# Patient Record
Sex: Female | Born: 1980 | Race: White | Hispanic: Yes | Marital: Single | Smoking: Current some day smoker
Health system: Southern US, Community
[De-identification: ages and names within clinical notes are randomized; demographics above are authoritative.]

## PROBLEM LIST (undated history)

## (undated) DIAGNOSIS — I10 Essential (primary) hypertension: Secondary | ICD-10-CM

## (undated) HISTORY — PX: HERNIA REPAIR: SHX51

## (undated) HISTORY — PX: OTHER SURGICAL HISTORY: SHX169

## (undated) HISTORY — PX: APPENDECTOMY: SHX54

## (undated) HISTORY — DX: Essential (primary) hypertension: I10

---
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
## 2012-09-29 ENCOUNTER — Encounter: Payer: Self-pay | Admitting: Family Medicine

## 2012-09-29 ENCOUNTER — Ambulatory Visit (INDEPENDENT_AMBULATORY_CARE_PROVIDER_SITE_OTHER): Payer: Medicaid Other | Admitting: Family Medicine

## 2012-09-29 VITALS — BP 140/76 | HR 74 | Temp 98.3°F | Ht 61.0 in | Wt 248.0 lb

## 2012-09-29 DIAGNOSIS — Z0000 Encounter for general adult medical examination without abnormal findings: Secondary | ICD-10-CM

## 2012-09-29 DIAGNOSIS — Z23 Encounter for immunization: Secondary | ICD-10-CM

## 2012-09-29 LAB — POCT CBC
Granulocyte percent: 66.6 %G (ref 37–80)
HCT, POC: 36.9 % — AB (ref 37.7–47.9)
Hemoglobin: 12 g/dL — AB (ref 12.2–16.2)
Lymph, poc: 2.6 (ref 0.6–3.4)
MCH, POC: 25.2 pg — AB (ref 27–31.2)
MCHC: 32.6 g/dL (ref 31.8–35.4)
MCV: 77.5 fL — AB (ref 80–97)
MPV: 8 fL (ref 0–99.8)
POC Granulocyte: 6 (ref 2–6.9)
POC LYMPH PERCENT: 29 %L (ref 10–50)
Platelet Count, POC: 331 10*3/uL (ref 142–424)
RBC: 4.8 M/uL (ref 4.04–5.48)
RDW, POC: 13.8 %
WBC: 9 10*3/uL (ref 4.6–10.2)

## 2012-09-29 NOTE — Progress Notes (Signed)
  Subjective:    Patient ID: Denise Berger, female    DOB: 1980-07-31, 32 y.o.   MRN: [id number]  HPI  This 32 y.o. female presents for evaluation of CPE without pap. She has an appointment scheduled Next week with female provider for pap.  She has been snoring but denies any hypersomnolence. She has hx of obesity.  She denies any acute medical problems.  Review of Systems    No chest pain, SOB, HA, dizziness, vision change, N/V, diarrhea, constipation, dysuria, urinary urgency or frequency, myalgias, arthralgias or rash.  Objective:   Physical Exam Vital signs noted  Well developed well nourished female.  HEENT - Head atraumatic Normocephalic                Eyes - PERRLA, Conjuctiva - clear Sclera- Clear EOMI                Ears - EAC's Wnl TM's Wnl Gross Hearing WNL                Nose - Nares patent                 Throat - oropharanx wnl Respiratory - Lungs CTA bilateral Cardiac - RRR S1 and S2 without murmur GI - Abdomen soft Nontender and bowel sounds active x 4 Extremities - No edema. Neuro - Grossly intact.       Assessment & Plan:  Need for Tdap vaccination - Plan: Tdap vaccine greater than or equal to 7yo IM  Routine general medical examination at a health care facility - Plan: POCT CBC, Lipid panel, CMP14+EGFR, Thyroid Panel With TSH, Vit D  25 hydroxy (rtn osteoporosis monitoring). Discussed with patient if snoring worsens and she is having hypersomnolence then would need Sleep study.  Obesity - Discussed aggressive weight loss efforts  Follow up this week for female exam.  Deatra Canter FNP

## 2012-09-29 NOTE — Patient Instructions (Addendum)

## 2012-09-30 LAB — LIPID PANEL
Chol/HDL Ratio: 4.3 ratio units (ref 0.0–4.4)
Cholesterol, Total: 175 mg/dL (ref 100–199)
HDL: 41 mg/dL (ref >39)
LDL Calculated: 114 mg/dL — ABNORMAL HIGH (ref 0–99)
Triglycerides: 99 mg/dL (ref 0–149)
VLDL Cholesterol Cal: 20 mg/dL (ref 5–40)

## 2012-09-30 LAB — CMP14+EGFR
ALT: 20 IU/L (ref 0–32)
AST: 19 IU/L (ref 0–40)
Albumin/Globulin Ratio: 2 (ref 1.1–2.5)
Albumin: 4.5 g/dL (ref 3.5–5.5)
Alkaline Phosphatase: 60 IU/L (ref 39–117)
BUN/Creatinine Ratio: 17 (ref 8–20)
BUN: 10 mg/dL (ref 6–20)
CO2: 28 mmol/L (ref 18–29)
Calcium: 9.7 mg/dL (ref 8.7–10.2)
Chloride: 96 mmol/L — ABNORMAL LOW (ref 97–108)
Creatinine, Ser: 0.59 mg/dL (ref 0.57–1.00)
GFR calc Af Amer: 142 mL/min/{1.73_m2} (ref >59)
GFR calc non Af Amer: 123 mL/min/{1.73_m2} (ref >59)
Globulin, Total: 2.3 g/dL (ref 1.5–4.5)
Glucose: 89 mg/dL (ref 65–99)
Potassium: 4.6 mmol/L (ref 3.5–5.2)
Sodium: 138 mmol/L (ref 134–144)
Total Bilirubin: 0.3 mg/dL (ref 0.0–1.2)
Total Protein: 6.8 g/dL (ref 6.0–8.5)

## 2012-09-30 LAB — THYROID PANEL WITH TSH
Free Thyroxine Index: 2.3 (ref 1.2–4.9)
T3 Uptake Ratio: 26 % (ref 24–39)
T4, Total: 9 ug/dL (ref 4.5–12.0)
TSH: 3.1 u[IU]/mL (ref 0.450–4.500)

## 2012-09-30 LAB — VITAMIN D 25 HYDROXY (VIT D DEFICIENCY, FRACTURES): Vit D, 25-Hydroxy: 29.7 ng/mL — ABNORMAL LOW (ref 30.0–100.0)

## 2012-10-06 ENCOUNTER — Ambulatory Visit (INDEPENDENT_AMBULATORY_CARE_PROVIDER_SITE_OTHER): Payer: Medicaid Other | Admitting: Nurse Practitioner

## 2012-10-06 VITALS — BP 135/82 | HR 74 | Temp 97.5°F | Ht 61.0 in | Wt 250.0 lb

## 2012-10-06 DIAGNOSIS — G43909 Migraine, unspecified, not intractable, without status migrainosus: Secondary | ICD-10-CM | POA: Insufficient documentation

## 2012-10-06 MED ORDER — KETOROLAC TROMETHAMINE 60 MG/2ML IM SOLN
60.0000 mg | Freq: Once | INTRAMUSCULAR | Status: AC
  Administered 2012-10-06: 60 mg via INTRAMUSCULAR

## 2012-10-06 MED ORDER — BUTALBITAL-APAP-CAFF-COD 50-325-40-30 MG PO CAPS: 1.0000 | ORAL_CAPSULE | ORAL | Status: DC | PRN

## 2012-10-06 NOTE — Patient Instructions (Signed)
Migraine Headache A migraine headache is an intense, throbbing pain on one or both sides of your head. A migraine can last for 30 minutes to several hours. CAUSES  The exact cause of a migraine headache is not always known. However, a migraine may be caused when nerves in the brain become irritated and release chemicals that cause inflammation. This causes pain. SYMPTOMS  Pain on one or both sides of your head.  Pulsating or throbbing pain.  Severe pain that prevents daily activities.  Pain that is aggravated by any physical activity.  Nausea, vomiting, or both.  Dizziness.  Pain with exposure to bright lights, loud noises, or activity.  General sensitivity to bright lights, loud noises, or smells. Before you get a migraine, you may get warning signs that a migraine is coming (aura). An aura may include:  Seeing flashing lights.  Seeing bright spots, halos, or zig-zag lines.  Having tunnel vision or blurred vision.  Having feelings of numbness or tingling.  Having trouble talking.  Having muscle weakness. MIGRAINE TRIGGERS  Alcohol.  Smoking.  Stress.  Menstruation.  Aged cheeses.  Foods or drinks that contain nitrates, glutamate, aspartame, or tyramine.  Lack of sleep.  Chocolate.  Caffeine.  Hunger.  Physical exertion.  Fatigue.  Medicines used to treat chest pain (nitroglycerine), birth control pills, estrogen, and some blood pressure medicines. DIAGNOSIS  A migraine headache is often diagnosed based on:  Symptoms.  Physical examination.  A CT scan or MRI of your head. TREATMENT Medicines may be given for pain and nausea. Medicines can also be given to help prevent recurrent migraines.  HOME CARE INSTRUCTIONS  Only take over-the-counter or prescription medicines for pain or discomfort as directed by your caregiver. The use of long-term narcotics is not recommended.  Lie down in a dark, quiet room when you have a migraine.  Keep a journal  to find out what may trigger your migraine headaches. For example, write down:  What you eat and drink.  How much sleep you get.  Any change to your diet or medicines.  Limit alcohol consumption.  Quit smoking if you smoke.  Get 7 to 9 hours of sleep, or as recommended by your caregiver.  Limit stress.  Keep lights dim if bright lights bother you and make your migraines worse. SEEK IMMEDIATE MEDICAL CARE IF:   Your migraine becomes severe.  You have a fever.  You have a stiff neck.  You have vision loss.  You have muscular weakness or loss of muscle control.  You start losing your balance or have trouble walking.  You feel faint or pass out.  You have severe symptoms that are different from your first symptoms. MAKE SURE YOU:   Understand these instructions.  Will watch your condition.  Will get help right away if you are not doing well or get worse. Document Released: 12/22/2004 Document Revised: 03/16/2011 Document Reviewed: 12/12/2010 [employer] Patient Information 2014 [employer].  

## 2012-10-06 NOTE — Progress Notes (Signed)
  Subjective:    Patient ID: Denise Berger, female    DOB: 01-20-1980, 32 y.o.   MRN: [id number]  HPI Patient has a long history of migraine headache- this current headache started yesterday- has taken excedrin migriane and that as not helped.     Review of Systems  Constitutional: Positive for fatigue.  Eyes: Positive for photophobia.  Respiratory: Negative.   Cardiovascular: Negative.   Gastrointestinal: Positive for nausea.  Neurological: Positive for light-headedness and headaches. Negative for dizziness, tremors, seizures, syncope, facial asymmetry, speech difficulty, weakness and numbness.       Objective:   Physical Exam  Constitutional: She is oriented to person, place, and time. She appears well-developed and well-nourished.  Cardiovascular: Normal rate, regular rhythm and normal heart sounds.   Pulmonary/Chest: Effort normal.  Neurological: She is alert and oriented to person, place, and time. She has normal reflexes. No cranial nerve deficit.  Psychiatric: She has a normal mood and affect. Her behavior is normal. Judgment and thought content normal.    BP 135/82  Pulse 74  Temp(Src) 97.5 F (36.4 C) (Oral)  Ht 5\' 1"  (1.549 m)  Wt 250 lb (113.399 kg)  BMI 47.26 kg/m2  LMP 09/26/2012       Assessment & Plan:   1. Migraines    Meds ordered this encounter  Medications  . ketorolac (TORADOL) injection 60 mg    Sig:   . butalbital-acetaminophen-caffeine (FIORICET/CODEINE) 50-325-40-30 MG per capsule    Sig: Take 1 capsule by mouth every 4 (four) hours as needed for headache.    Dispense:  30 capsule    Refill:  0    Order Specific Question:  Supervising Provider    Answer:  Ernestina Penna [[id number]]   Rest  Cool compress to forehead RTO prn  [name], FNP

## 2012-10-17 ENCOUNTER — Telehealth: Payer: Self-pay | Admitting: *Deleted

## 2012-10-17 NOTE — Telephone Encounter (Signed)
Ins will not cover fiorinal with codeine unless pt has tried and failed codeine/acetaminophen or hydrochodone/acetaminophen or hydrcocodone/ ibuprofen or pentazocine/acetaminophen.

## 2012-10-17 NOTE — Telephone Encounter (Signed)
Let patient know and ask if has used any of these

## 2012-10-19 ENCOUNTER — Telehealth: Payer: Self-pay | Admitting: *Deleted

## 2012-10-19 MED ORDER — ACETAMINOPHEN-CODEINE #3 300-30 MG PO TABS: ORAL_TABLET | ORAL | Status: DC

## 2012-10-19 NOTE — Telephone Encounter (Signed)
Called and talked with Denise Berger and she has not taken any of the three alternatives and she said any of them would be fine/ Just call in to [facility]. Thanks for handling this.

## 2012-10-19 NOTE — Telephone Encounter (Signed)
Will need to pick up new rx

## 2012-10-20 NOTE — Telephone Encounter (Signed)
Up front 

## 2012-11-30 ENCOUNTER — Ambulatory Visit (INDEPENDENT_AMBULATORY_CARE_PROVIDER_SITE_OTHER): Payer: Medicaid Other | Admitting: Nurse Practitioner

## 2012-11-30 ENCOUNTER — Encounter (INDEPENDENT_AMBULATORY_CARE_PROVIDER_SITE_OTHER): Payer: Self-pay

## 2012-11-30 ENCOUNTER — Encounter: Payer: Self-pay | Admitting: Nurse Practitioner

## 2012-11-30 VITALS — BP 143/92 | HR 81 | Temp 98.6°F | Ht 61.0 in | Wt 251.0 lb

## 2012-11-30 DIAGNOSIS — J029 Acute pharyngitis, unspecified: Secondary | ICD-10-CM

## 2012-11-30 DIAGNOSIS — J019 Acute sinusitis, unspecified: Secondary | ICD-10-CM

## 2012-11-30 DIAGNOSIS — R5381 Other malaise: Secondary | ICD-10-CM

## 2012-11-30 DIAGNOSIS — R531 Weakness: Secondary | ICD-10-CM

## 2012-11-30 LAB — POCT RAPID STREP A (OFFICE): Rapid Strep A Screen: NEGATIVE

## 2012-11-30 MED ORDER — AZITHROMYCIN 250 MG PO TABS: ORAL_TABLET | ORAL | Status: DC

## 2012-11-30 NOTE — Patient Instructions (Signed)

## 2012-11-30 NOTE — Progress Notes (Signed)
Subjective:    Patient ID: Denise Berger, female    DOB: 09-10-1980, 32 y.o.   MRN: [id number]  HPI Patient in c/o sore throat and cough that started last week- got better then started back again yesterday- said that throat hurts really bad.  * She has also had some episodes where she feels shaky- her father is a diabetic and he has checked her blood sugar and sometimes it has been over 200.  Review of Systems  Constitutional: Positive for appetite change and fatigue. Negative for fever and chills. Activity change: increased.  HENT: Positive for congestion, postnasal drip, rhinorrhea, sinus pressure, sore throat and trouble swallowing. Negative for ear pain.   Respiratory: Positive for cough.   Cardiovascular: Negative.   Gastrointestinal: Negative.   Genitourinary: Negative.   Neurological: Positive for weakness.       Objective:   Physical Exam  Constitutional: She appears well-developed and well-nourished.  HENT:  Right Ear: Hearing, tympanic membrane, external ear and ear canal normal.  Left Ear: Hearing, tympanic membrane, external ear and ear canal normal.  Nose: Mucosal edema and rhinorrhea present. Right sinus exhibits maxillary sinus tenderness. Right sinus exhibits no frontal sinus tenderness. Left sinus exhibits maxillary sinus tenderness. Left sinus exhibits no frontal sinus tenderness.  Mouth/Throat: Uvula is midline and mucous membranes are normal. Posterior oropharyngeal erythema (mild) present. No oropharyngeal exudate or posterior oropharyngeal edema.  Eyes: Conjunctivae and EOM are normal. Pupils are equal, round, and reactive to light.  Neck: Normal range of motion. Neck supple. No thyromegaly present.  Cardiovascular: Normal rate, normal heart sounds and intact distal pulses.   Pulmonary/Chest: Effort normal and breath sounds normal.  Abdominal: Soft. Bowel sounds are normal.  Lymphadenopathy:    She has no cervical adenopathy.  Psychiatric: She has a normal mood  and affect. Her behavior is normal. Judgment and thought content normal.   BP 143/92  Pulse 81  Temp(Src) 98.6 F (37 C) (Oral)  Ht 5\' 1"  (1.549 m)  Wt 251 lb (113.853 kg)  BMI 47.45 kg/m2 Results for orders placed in visit on 11/30/12  POCT RAPID STREP A (OFFICE)      Result Value Range   Rapid Strep A Screen Negative  Negative          Assessment & Plan:   1. Sore throat   2. Acute rhinosinusitis   3. Weakness    Meds ordered this encounter  Medications  . azithromycin (ZITHROMAX) 250 MG tablet    Sig: As directed    Dispense:  6 each    Refill:  0    Order Specific Question:  Supervising Provider    Answer:  Ernestina Penna [[id number]]   Orders Placed This Encounter  Procedures  . CMP14+EGFR  . Thyroid Panel With TSH  . POCT rapid strep A   1. Take meds as prescribed 2. Use a cool mist humidifier especially during the winter months and when heat has  been humid. 3. Use saline nose sprays frequently 4. Saline irrigations of the nose can be very helpful if done frequently.  * 4X daily for 1 week*  * Use of a nettie pot can be helpful with this. Follow directions with this* 5. Drink plenty of fluids 6. Keep thermostat turn down low 7.For any cough or congestion  Use plain Mucinex- regular strength or max strength is fine   * Children- consult with Pharmacist for dosing 8. For fever or aces or pains- take tylenol or ibuprofen appropriate  for age and weight.  * for fevers greater than 101 orally you may alternate ibuprofen and tylenol every  3 hours.   Mary-Margaret Hassell Done, FNP

## 2012-12-01 LAB — CMP14+EGFR
ALT: 18 IU/L (ref 0–32)
AST: 17 IU/L (ref 0–40)
Albumin/Globulin Ratio: 1.8 (ref 1.1–2.5)
Albumin: 4.4 g/dL (ref 3.5–5.5)
Alkaline Phosphatase: 56 IU/L (ref 39–117)
BUN/Creatinine Ratio: 20 (ref 8–20)
BUN: 11 mg/dL (ref 6–20)
CO2: 28 mmol/L (ref 18–29)
Calcium: 9.1 mg/dL (ref 8.7–10.2)
Chloride: 98 mmol/L (ref 97–108)
Creatinine, Ser: 0.56 mg/dL — ABNORMAL LOW (ref 0.57–1.00)
GFR calc Af Amer: 145 mL/min/{1.73_m2} (ref >59)
GFR calc non Af Amer: 126 mL/min/{1.73_m2} (ref >59)
Globulin, Total: 2.5 g/dL (ref 1.5–4.5)
Glucose: 96 mg/dL (ref 65–99)
Potassium: 4.4 mmol/L (ref 3.5–5.2)
Sodium: 137 mmol/L (ref 134–144)
Total Bilirubin: <0.2 mg/dL (ref 0.0–1.2)
Total Protein: 6.9 g/dL (ref 6.0–8.5)

## 2012-12-01 LAB — THYROID PANEL WITH TSH
Free Thyroxine Index: 1.8 (ref 1.2–4.9)
T3 Uptake Ratio: 25 % (ref 24–39)
T4, Total: 7.1 ug/dL (ref 4.5–12.0)
TSH: 3.09 u[IU]/mL (ref 0.450–4.500)

## 2012-12-01 LAB — SPECIMEN STATUS REPORT

## 2012-12-06 ENCOUNTER — Telehealth: Payer: Self-pay | Admitting: Family Medicine

## 2012-12-06 NOTE — Telephone Encounter (Signed)
Message copied by Azalee Course on Tue Dec 06, 2012  9:50 AM ------      Message from: Bennie Pierini      Created: Tue Dec 06, 2012  8:58 AM       All labs are within normal limits ------

## 2013-01-23 ENCOUNTER — Telehealth: Payer: Self-pay | Admitting: Nurse Practitioner

## 2013-01-23 NOTE — Telephone Encounter (Signed)
NTBS.

## 2013-01-24 NOTE — Telephone Encounter (Signed)
Appt scheduled for 01-25-13.

## 2013-01-25 ENCOUNTER — Encounter: Payer: Self-pay | Admitting: Nurse Practitioner

## 2013-01-25 ENCOUNTER — Ambulatory Visit (INDEPENDENT_AMBULATORY_CARE_PROVIDER_SITE_OTHER): Payer: Medicaid Other | Admitting: Nurse Practitioner

## 2013-01-25 VITALS — BP 150/102 | HR 89 | Temp 97.9°F | Ht 61.0 in | Wt 251.0 lb

## 2013-01-25 DIAGNOSIS — I10 Essential (primary) hypertension: Secondary | ICD-10-CM

## 2013-01-25 DIAGNOSIS — R51 Headache: Secondary | ICD-10-CM

## 2013-01-25 DIAGNOSIS — R519 Headache, unspecified: Secondary | ICD-10-CM

## 2013-01-25 MED ORDER — HYDROCHLOROTHIAZIDE 12.5 MG PO CAPS: 12.5000 mg | ORAL_CAPSULE | Freq: Every day | ORAL | Status: DC

## 2013-01-25 NOTE — Patient Instructions (Addendum)
Hypertension As your heart beats, it forces blood through your arteries. This force is your blood pressure. If the pressure is too high, it is called hypertension (HTN) or high blood pressure. HTN is dangerous because you may have it and not know it. High blood pressure may mean that your heart has to work harder to pump blood. Your arteries may be narrow or stiff. The extra work puts you at risk for heart disease, stroke, and other problems.  Blood pressure consists of two numbers, a higher number over a lower, 110/72, for example. It is stated as "110 over 72." The ideal is below 120 for the top number (systolic) and under 80 for the bottom (diastolic). Write down your blood pressure today. You should pay close attention to your blood pressure if you have certain conditions such as:  Heart failure.  Prior heart attack.  Diabetes  Chronic kidney disease.  Prior stroke.  Multiple risk factors for heart disease. To see if you have HTN, your blood pressure should be measured while you are seated with your arm held at the level of the heart. It should be measured at least twice. A one-time elevated blood pressure reading (especially in the Emergency Department) does not mean that you need treatment. There may be conditions in which the blood pressure is different between your right and left arms. It is important to see your caregiver soon for a recheck. Most people have essential hypertension which means that there is not a specific cause. This type of high blood pressure may be lowered by changing lifestyle factors such as:  Stress.  Smoking.  Lack of exercise.  Excessive weight.  Drug/tobacco/alcohol use.  Eating less salt. Most people do not have symptoms from high blood pressure until it has caused damage to the body. Effective treatment can often prevent, delay or reduce that damage. TREATMENT  When a cause has been identified, treatment for high blood pressure is directed at the  cause. There are a large number of medications to treat HTN. These fall into several categories, and your caregiver will help you select the medicines that are best for you. Medications may have side effects. You should review side effects with your caregiver. If your blood pressure stays high after you have made lifestyle changes or started on medicines,   Your medication(s) may need to be changed.  Other problems may need to be addressed.  Be certain you understand your prescriptions, and know how and when to take your medicine.  Be sure to follow up with your caregiver within the time frame advised (usually within two weeks) to have your blood pressure rechecked and to review your medications.  If you are taking more than one medicine to lower your blood pressure, make sure you know how and at what times they should be taken. Taking two medicines at the same time can result in blood pressure that is too low. SEEK IMMEDIATE MEDICAL CARE IF:  You develop a severe headache, blurred or changing vision, or confusion.  You have unusual weakness or numbness, or a faint feeling.  You have severe chest or abdominal pain, vomiting, or breathing problems. MAKE SURE YOU:   Understand these instructions.  Will watch your condition.  Will get help right away if you are not doing well or get worse. Document Released: 12/22/2004 Document Revised: 03/16/2011 Document Reviewed: 08/12/2007 [facility] Patient Information 2014 [place], [place].  Keep headache diary including sleep and diet Use Tylenol #3 as prescribed as needed for  sever headache Reduce caffeine intake and increase water daily Low sodium diet Start exercise Record BP at home F/u 1 month

## 2013-01-25 NOTE — Progress Notes (Signed)
   Subjective:    Patient ID: Denise Berger, female    DOB: Jan 11, 1980, 33 y.o.   MRN: [id number]  Headache  This is a recurrent problem. The current episode started in the past 7 days. The problem occurs daily. The problem has been gradually worsening. The pain is located in the right unilateral region. The pain radiates to the face. The pain quality is similar to prior headaches. The quality of the pain is described as throbbing and pulsating. The pain is at a severity of 8/10. The pain is severe. Associated symptoms include blurred vision, dizziness, nausea, phonophobia, photophobia and scalp tenderness. Pertinent negatives include no fever, numbness, sinus pressure, sore throat, visual change or vomiting. The symptoms are aggravated by bright light, noise and activity. She has tried acetaminophen (Tyloenol III only dulls the pain but minimal relief) for the symptoms. Her past medical history is significant for hypertension, migraine headaches and migraines in the family.      Review of Systems  Constitutional: Negative for fever, chills and fatigue.  HENT: Negative for congestion, postnasal drip, sinus pressure and sore throat.   Eyes: Positive for blurred vision and photophobia.  Respiratory: Negative for shortness of breath.   Cardiovascular: Negative for chest pain.  Gastrointestinal: Positive for nausea. Negative for vomiting.  Neurological: Positive for dizziness and headaches. Negative for speech difficulty, light-headedness and numbness.  All other systems reviewed and are negative.       Objective:   Physical Exam  Constitutional: She is oriented to person, place, and time. She appears well-developed and well-nourished.  HENT:  Head: Normocephalic and atraumatic.  Right Ear: External ear normal.  Left Ear: External ear normal.  Mouth/Throat: Oropharynx is clear and moist.  Eyes: Conjunctivae are normal. Pupils are equal, round, and reactive to light.  Neck: Normal range of  motion.  Cardiovascular: Normal rate and regular rhythm.   Pulmonary/Chest: Effort normal and breath sounds normal.  Neurological: She is alert and oriented to person, place, and time. She has normal strength and normal reflexes. No cranial nerve deficit or sensory deficit.  Skin: Skin is warm and dry.  Psychiatric: She has a normal mood and affect. Thought content normal.    BP 150/102  Pulse 89  Temp(Src) 97.9 F (36.6 C) (Oral)  Ht 5\' 1"  (1.549 m)  Wt 251 lb (113.853 kg)  BMI 47.45 kg/m2       Assessment & Plan:   1. Hypertension   2. Frequent headaches    Meds ordered this encounter  Medications  . hydrochlorothiazide (MICROZIDE) 12.5 MG capsule    Sig: Take 1 capsule (12.5 mg total) by mouth daily.    Dispense:  30 capsule    Refill:  5    Order Specific Question:  Supervising Provider    Answer:  Ernestina PennaMOORE, DONALD W [[id number]]   Keep headache diary including sleep and diet Use Tylenol #3 as prescribed as needed for sever headache Reduce caffeine intake and increase water daily Low sodium diet Start exercise Record BP at home F/u 1 month   Mary-Margaret Daphine DeutscherMartin, FNP

## 2013-02-23 ENCOUNTER — Ambulatory Visit (INDEPENDENT_AMBULATORY_CARE_PROVIDER_SITE_OTHER): Payer: Medicaid Other | Admitting: Nurse Practitioner

## 2013-02-23 ENCOUNTER — Encounter: Payer: Self-pay | Admitting: Nurse Practitioner

## 2013-02-23 VITALS — BP 132/81 | HR 72 | Temp 99.9°F | Ht 61.0 in | Wt 254.0 lb

## 2013-02-23 DIAGNOSIS — I10 Essential (primary) hypertension: Secondary | ICD-10-CM

## 2013-02-23 MED ORDER — ACETAMINOPHEN-CODEINE #3 300-30 MG PO TABS: ORAL_TABLET | ORAL | Status: DC

## 2013-02-23 NOTE — Patient Instructions (Signed)

## 2013-02-23 NOTE — Progress Notes (Signed)
   Subjective:    Patient ID: Denise Berger, female    DOB: [birth year], 33 y.o.   MRN: [id number]  HPI Patient is here for followup of blood pressure and headaches. Patient has been keeping a journal of blood pressure and headaches. Blood pressure has been running less than 140/90 for past month. Patient has had 2 headaches in the past month which has decreased from once a week. Patient has decreased caffeine, increased water intake, and has started walking. Patient is satisfied with her current treatment.    Review of Systems  HENT: Negative.   Respiratory: Negative.   Cardiovascular: Negative.   Neurological: Negative.   All other systems reviewed and are negative.       Objective:   Physical Exam  Constitutional: She is oriented to person, place, and time.  Pulmonary/Chest: Effort normal and breath sounds normal.  Neurological: She is alert and oriented to person, place, and time.  Skin: Skin is warm and dry.  Psychiatric: She has a normal mood and affect. Her behavior is normal. Judgment and thought content normal.    BP 132/81  Pulse 72  Temp(Src) 99.9 F (37.7 C) (Oral)  Ht 5\' 1"  (1.549 m)  Wt 254 lb (115.214 kg)  BMI 48.02 kg/m2       Assessment & Plan:   1. Hypertension    Continue current meds Continue o keep diary of blood pressures Avoid salt in diet exercise encouraged Follow up in 3 months   Meds ordered this encounter  Medications  . acetaminophen-codeine (TYLENOL #3) 300-30 MG per tablet    Sig: 1 po prn headache    Dispense:  30 tablet    Refill:  0    Order Specific Question:  Supervising Provider    Answer:  Ernestina PennaMOORE, DONALD W [[id number]]    Mary-Margaret Daphine DeutscherMartin, FNP

## 2013-02-24 ENCOUNTER — Ambulatory Visit: Payer: Medicaid Other | Admitting: Nurse Practitioner

## 2013-03-18 ENCOUNTER — Ambulatory Visit (INDEPENDENT_AMBULATORY_CARE_PROVIDER_SITE_OTHER): Payer: Medicaid Other | Admitting: Nurse Practitioner

## 2013-03-18 ENCOUNTER — Encounter: Payer: Self-pay | Admitting: Nurse Practitioner

## 2013-03-18 VITALS — BP 134/78 | HR 79 | Temp 97.6°F | Ht 61.0 in | Wt 253.0 lb

## 2013-03-18 DIAGNOSIS — R51 Headache: Secondary | ICD-10-CM

## 2013-03-18 MED ORDER — PROMETHAZINE HCL 25 MG/ML IJ SOLN
12.5000 mg | Freq: Once | INTRAMUSCULAR | Status: AC
  Administered 2013-03-18: 12.5 mg via INTRAMUSCULAR

## 2013-03-18 MED ORDER — PROMETHAZINE HCL 25 MG/ML IJ SOLN: 12.5000 mg | Freq: Once | INTRAMUSCULAR | Status: DC

## 2013-03-18 MED ORDER — MEPERIDINE HCL 50 MG/ML IJ SOLN
50.0000 mg | Freq: Once | INTRAMUSCULAR | Status: AC
Start: 2013-03-18 — End: 2013-03-18
  Administered 2013-03-18: 50 mg via INTRAMUSCULAR

## 2013-03-18 MED ORDER — MEPERIDINE HCL 25 MG/ML IJ SOLN: 50.0000 mg | INTRAMUSCULAR | Status: DC

## 2013-03-18 NOTE — Patient Instructions (Signed)
Migraine Headache A migraine headache is an intense, throbbing pain on one or both sides of your head. A migraine can last for 30 minutes to several hours. CAUSES  The exact cause of a migraine headache is not always known. However, a migraine may be caused when nerves in the brain become irritated and release chemicals that cause inflammation. This causes pain. Certain things may also trigger migraines, such as:  Alcohol.  Smoking.  Stress.  Menstruation.  Aged cheeses.  Foods or drinks that contain nitrates, glutamate, aspartame, or tyramine.  Lack of sleep.  Chocolate.  Caffeine.  Hunger.  Physical exertion.  Fatigue.  Medicines used to treat chest pain (nitroglycerine), birth control pills, estrogen, and some blood pressure medicines. SIGNS AND SYMPTOMS  Pain on one or both sides of your head.  Pulsating or throbbing pain.  Severe pain that prevents daily activities.  Pain that is aggravated by any physical activity.  Nausea, vomiting, or both.  Dizziness.  Pain with exposure to bright lights, loud noises, or activity.  General sensitivity to bright lights, loud noises, or smells. Before you get a migraine, you may get warning signs that a migraine is coming (aura). An aura may include:  Seeing flashing lights.  Seeing bright spots, halos, or zig-zag lines.  Having tunnel vision or blurred vision.  Having feelings of numbness or tingling.  Having trouble talking.  Having muscle weakness. DIAGNOSIS  A migraine headache is often diagnosed based on:  Symptoms.  Physical exam.  A CT scan or MRI of your head. These imaging tests cannot diagnose migraines, but they can help rule out other causes of headaches. TREATMENT Medicines may be given for pain and nausea. Medicines can also be given to help prevent recurrent migraines.  HOME CARE INSTRUCTIONS  Only take over-the-counter or prescription medicines for pain or discomfort as directed by your  health care provider. The use of long-term narcotics is not recommended.  Lie down in a dark, quiet room when you have a migraine.  Keep a journal to find out what may trigger your migraine headaches. For example, write down:  What you eat and drink.  How much sleep you get.  Any change to your diet or medicines.  Limit alcohol consumption.  Quit smoking if you smoke.  Get 7 9 hours of sleep, or as recommended by your health care provider.  Limit stress.  Keep lights dim if bright lights bother you and make your migraines worse. SEEK IMMEDIATE MEDICAL CARE IF:   Your migraine becomes severe.  You have a fever.  You have a stiff neck.  You have vision loss.  You have muscular weakness or loss of muscle control.  You start losing your balance or have trouble walking.  You feel faint or pass out.  You have severe symptoms that are different from your first symptoms. MAKE SURE YOU:   Understand these instructions.  Will watch your condition.  Will get help right away if you are not doing well or get worse. Document Released: 12/22/2004 Document Revised: 10/12/2012 Document Reviewed: 08/29/2012 [employer] Patient Information 2014 [employer].  

## 2013-03-18 NOTE — Progress Notes (Signed)
   Subjective:    Patient ID: Denise Berger, female    DOB: 31-Jan-1980, 31 y.o.   MRN: [id number]  HPI Patient presents today complaining of headache that began yesterday afternoon. Has been experiencing headaches since October. Light, sound, and position aggravate pain. Pain is located at base of head and neck. Rates pain at 6/10. Describes pain as throbbing. Last headache was in December randomly. No identifiable triggers. Took 2 tylenol which decreased discomfort. Has not taken any medication since yesterday.    Review of Systems  Gastrointestinal: Positive for nausea. Negative for vomiting.  Neurological: Positive for dizziness and headaches.  All other systems reviewed and are negative.       Objective:   Physical Exam  Constitutional: She is oriented to person, place, and time.  Cardiovascular: Normal rate, regular rhythm and normal heart sounds.   Pulmonary/Chest: Effort normal and breath sounds normal.  Neurological: She is alert and oriented to person, place, and time.  Skin: Skin is warm and dry.  Psychiatric: She has a normal mood and affect. Her behavior is normal. Judgment and thought content normal.   BP 134/78  Pulse 79  Temp(Src) 97.6 F (36.4 C) (Oral)  Ht 5\' 1"  (1.549 m)  Wt 253 lb (114.76 kg)  BMI 47.83 kg/m2        Assessment & Plan:   1. Headache(784.0)    Demerol 50 mg IM Promethazine 12.5 mg IM  Rest Avoid possible headache triggers RTC if no improvement  Mary-Margaret Daphine DeutscherMartin, FNP

## 2013-04-11 ENCOUNTER — Ambulatory Visit (INDEPENDENT_AMBULATORY_CARE_PROVIDER_SITE_OTHER): Payer: Medicaid Other | Admitting: Family Medicine

## 2013-04-11 ENCOUNTER — Encounter: Payer: Self-pay | Admitting: Family Medicine

## 2013-04-11 ENCOUNTER — Encounter (INDEPENDENT_AMBULATORY_CARE_PROVIDER_SITE_OTHER): Payer: Self-pay

## 2013-04-11 VITALS — BP 129/74 | HR 87 | Temp 97.1°F | Wt 251.6 lb

## 2013-04-11 DIAGNOSIS — J069 Acute upper respiratory infection, unspecified: Secondary | ICD-10-CM

## 2013-04-11 MED ORDER — METHYLPREDNISOLONE ACETATE 80 MG/ML IJ SUSP
80.0000 mg | Freq: Once | INTRAMUSCULAR | Status: AC
  Administered 2013-04-11: 80 mg via INTRAMUSCULAR

## 2013-04-11 MED ORDER — AZITHROMYCIN 250 MG PO TABS: ORAL_TABLET | ORAL | Status: DC

## 2013-04-11 NOTE — Progress Notes (Signed)
   Subjective:    Patient ID: Charleston Ropesnna Canto, female    DOB: 1980-06-07, 33 y.o.   MRN: [id number]  HPI This 33 y.o. female presents for evaluation of uri sx's and congestion.   Review of Systems C/o uri sx's   No chest pain, SOB, HA, dizziness, vision change, N/V, diarrhea, constipation, dysuria, urinary urgency or frequency, myalgias, arthralgias or rash.  Objective:   Physical Exam Vital signs noted  Well developed well nourished female.  HEENT - Head atraumatic Normocephalic                Eyes - PERRLA, Conjuctiva - clear Sclera- Clear EOMI                Ears - EAC's Wnl TM's Wnl Gross Hearing WNL                 Throat - oropharanx wnl Respiratory - Lungs CTA bilateral Cardiac - RRR S1 and S2 without murmur GI - Abdomen soft Nontender and bowel sounds active x 4       Assessment & Plan:  URI, acute - Plan: azithromycin (ZITHROMAX) 250 MG tablet, methylPREDNISolone acetate (DEPO-MEDROL) injection 80 mg Push po fluids, rest, tylenol and motrin otc prn as directed for fever, arthralgias, and myalgias.  Follow up prn if sx's continue or persist.  Deatra CanterWilliam J Dayanis Bergquist FNP

## 2013-05-04 ENCOUNTER — Telehealth: Payer: Self-pay | Admitting: Nurse Practitioner

## 2013-05-04 NOTE — Telephone Encounter (Signed)
Patient is more stressed than normal due to the recent passing of her father. She feels that she is being snappy with her son. Also feels that her BP is elevated and she has a headache. She is taking her BP and migraine medications. Suggested she increase water intake to help with blood pressure and headache and to try stepping into another room when she feels overwhelmed. Appt scheduled. Patient aware. She will call back if necessary.

## 2013-05-08 ENCOUNTER — Ambulatory Visit: Payer: Medicaid Other | Admitting: Nurse Practitioner

## 2013-05-17 ENCOUNTER — Encounter: Payer: Self-pay | Admitting: Family Medicine

## 2013-05-17 ENCOUNTER — Ambulatory Visit (INDEPENDENT_AMBULATORY_CARE_PROVIDER_SITE_OTHER): Payer: Medicaid Other | Admitting: Family Medicine

## 2013-05-17 VITALS — BP 123/77 | HR 84 | Temp 97.7°F | Ht 61.0 in | Wt 251.0 lb

## 2013-05-17 DIAGNOSIS — J069 Acute upper respiratory infection, unspecified: Secondary | ICD-10-CM

## 2013-05-17 MED ORDER — METHYLPREDNISOLONE (PAK) 4 MG PO TABS: ORAL_TABLET | ORAL | Status: DC

## 2013-05-17 MED ORDER — AZITHROMYCIN 250 MG PO TABS: ORAL_TABLET | ORAL | Status: DC

## 2013-05-17 NOTE — Progress Notes (Signed)
   Subjective:    Patient ID: Denise Berger, female    DOB: 03/08/1980, 33 y.o.   MRN: [id number]  HPI  This 33 y.o. female presents for evaluation of URI sx's for a week.  Review of Systems No chest pain, SOB, HA, dizziness, vision change, N/V, diarrhea, constipation, dysuria, urinary urgency or frequency, myalgias, arthralgias or rash.     Objective:   Physical Exam  Vital signs noted  Well developed well nourished female.  HEENT - Head atraumatic Normocephalic                Eyes - PERRLA, Conjuctiva - clear Sclera- Clear EOMI                Ears - EAC's Wnl TM's Wnl Gross Hearing WNL                Nose - Nares patent                 Throat - oropharanx wnl Respiratory - Lungs CTA bilateral Cardiac - RRR S1 and S2 without murmur GI - Abdomen soft Nontender and bowel sounds active x 4 Extremities - No edema. Neuro - Grossly intact.      Assessment & Plan:  URI (upper respiratory infection) - Plan: azithromycin (ZITHROMAX) 250 MG tablet, methylPREDNIsolone (MEDROL DOSPACK) 4 MG tablet Push po fluids, rest, tylenol and motrin otc prn as directed for fever, arthralgias, and myalgias.  Follow up prn if sx's continue or persist.  Deatra CanterWilliam J Libra Gatz FNP

## 2013-05-26 ENCOUNTER — Ambulatory Visit: Payer: Medicaid Other | Admitting: Nurse Practitioner

## 2013-07-03 ENCOUNTER — Ambulatory Visit (INDEPENDENT_AMBULATORY_CARE_PROVIDER_SITE_OTHER): Payer: Medicaid Other | Admitting: Nurse Practitioner

## 2013-07-03 ENCOUNTER — Encounter: Payer: Self-pay | Admitting: Nurse Practitioner

## 2013-07-03 VITALS — BP 128/88 | HR 84 | Temp 98.2°F | Ht 61.0 in | Wt 258.8 lb

## 2013-07-03 DIAGNOSIS — I10 Essential (primary) hypertension: Secondary | ICD-10-CM

## 2013-07-03 DIAGNOSIS — Z713 Dietary counseling and surveillance: Secondary | ICD-10-CM

## 2013-07-03 DIAGNOSIS — Z6841 Body Mass Index (BMI) 40.0 and over, adult: Secondary | ICD-10-CM

## 2013-07-03 MED ORDER — HYDROCHLOROTHIAZIDE 12.5 MG PO CAPS: 12.5000 mg | ORAL_CAPSULE | Freq: Every day | ORAL | Status: DC

## 2013-07-03 NOTE — Progress Notes (Signed)
   Subjective:    Patient ID: Denise Berger, female    DOB: [birth year], 33 y.o.   MRN: [id number]  Hypertension This is a chronic problem. The current episode started more than 1 month ago. The problem has been resolved since onset. The problem is controlled. Pertinent negatives include no blurred vision, headaches, neck pain, palpitations, peripheral edema or shortness of breath. There are no associated agents to hypertension. Risk factors for coronary artery disease include stress, obesity, dyslipidemia, diabetes mellitus and family history. Past treatments include diuretics. The current treatment provides moderate improvement. Compliance problems include diet and exercise.       Review of Systems  Eyes: Negative for blurred vision.  Respiratory: Negative for shortness of breath.   Cardiovascular: Negative for palpitations.  Musculoskeletal: Negative for neck pain.  Neurological: Negative for headaches.       Objective:   Physical Exam  Constitutional: She is oriented to person, place, and time. She appears well-developed and well-nourished.  HENT:  Nose: Nose normal.  Mouth/Throat: Oropharynx is clear and moist.  Eyes: EOM are normal.  Neck: Trachea normal, normal range of motion and full passive range of motion without pain. Neck supple. No JVD present. Carotid bruit is not present. No thyromegaly present.  Cardiovascular: Normal rate, regular rhythm, normal heart sounds and intact distal pulses.  Exam reveals no gallop and no friction rub.   No murmur heard. Pulmonary/Chest: Effort normal and breath sounds normal.  Abdominal: Soft. Bowel sounds are normal. She exhibits no distension and no mass. There is no tenderness.  Musculoskeletal: Normal range of motion.  Lymphadenopathy:    She has no cervical adenopathy.  Neurological: She is alert and oriented to person, place, and time. She has normal reflexes.  Skin: Skin is warm and dry.  Psychiatric: She has a normal mood and affect.  Her behavior is normal. Judgment and thought content normal.   BP 128/88  Pulse 84  Temp(Src) 98.2 F (36.8 C) (Oral)  Ht $R'5\' 1"'iy$  (1.549 m)  Wt 258 lb 12.8 oz (117.391 kg)  BMI 48.93 kg/m2  LMP 06/06/2013         Assessment & Plan:   1. Essential hypertension, benign   2. BMI 45.0-49.9, adult   3. Weight loss counseling, encounter for    Orders Placed This Encounter  Procedures  . CMP14+EGFR  . NMR, lipoprofile   Meds ordered this encounter  Medications  . hydrochlorothiazide (MICROZIDE) 12.5 MG capsule    Sig: Take 1 capsule (12.5 mg total) by mouth daily.    Dispense:  30 capsule    Refill:  5    Order Specific Question:  Supervising Provider    Answer:  Chipper Herb [[id number]]   Dicussed bariatric surgery with patient Patient to make appointment for PAP Labs pending Health maintenance reviewed Diet and exercise encouraged Continue all meds Follow up  In 3 months   [place], FNP

## 2013-07-03 NOTE — Patient Instructions (Signed)
Gastric Bypass Surgery Care After Refer to this sheet in the next few weeks. These discharge instructions provide you with general information on caring for yourself after you leave the hospital. Your caregiver may also give you specific instructions. Your treatment has been planned according to the most current medical practices available, but unavoidable complications sometimes occur. If you have any problems or questions after discharge, call your caregiver. HOME CARE INSTRUCTIONS  Activity  Take frequent walks throughout the day. This will help to prevent blood clots. Do not sit for longer than 45 minutes to 1 hour while awake for 4 to 6 weeks after surgery.  Continue to do coughing and deep breathing exercises once you get home. This will help to prevent pneumonia.  Do not do strenuous activities, such as heavy lifting, pushing, or pulling, until after your follow-up visit with your caregiver. Do not lift anything heavier than 10 lb (4.5 kg).  Talk with your caregiver about when you may return to work and your exercise routine.  Do not drive while taking prescription pain medicine. Nutrition  It is very important that you drink at least 80 oz (2,400 mL) of fluid a day.  You should stay on a liquid diet until your follow-up visit with your caregiver. Keep sugar-free, liquid items on hand, including:  Tea: hot or cold. Drink only decaffeinated for the first month.  Broths: beef, chicken, vegetable.  Others: water, sugar-free frozen ice pops, flavored water, gelatin (after 1 week).  Do not consume caffeine for 1 month. Large amounts of caffeine can cause dehydration.  A dietician may also give you specific instructions.  Follow your caregiver's recommendations about vitamins and protein requirements after surgery. Hygiene  You may shower and wash your hair 2 days after surgery. Pat incisions dry. Do not rub incisions with a washcloth or towel.  Follow your caregiver's  recommendations about baths and pools following surgery. Pain control  If a prescription medicine was given, follow your caregiver's directions.  You may feel some gas pain caused by the carbon dioxide used to inflate your abdomen during surgery. This pain can be felt in your chest, shoulder, back, or abdominal area. Moving around often is advised. Incision care  You may have 4 or more small incisions. They are closed with skin adhesive strips. Skin adhesive strips can get wet and will fall off on their own. Check your incisions and surrounding area daily for any redness, swelling, discoloration, fluid (drainage), or bleeding. Dark red, dried blood may appear under these coverings. This is normal.  If you have a drain, it will be removed at your follow-up visit or before you leave the hospital.  If your drain is left in, follow your caregiver's instructions on drain care.  If your drain is taken out, keep a clean, dry bandage over the drain site. SEEK MEDICAL CARE IF:   You develop persistent nausea and vomiting.  You have pain and discomfort with swallowing.  You have pain, swelling, or warmth in the lower extremities.  You have an oral temperature above 102 F (38.9 C).  You develop chills.  Your incision sites look red, swollen, or have drainage.  Your stool is black, tarry, or maroon in color.  You are lightheaded when standing.  You notice a bruise getting larger.  You have any questions or concerns. SEEK IMMEDIATE MEDICAL CARE IF:   You have chest pain.  You have severe calf pain or pain not relieved by medicine.  You develop shortness of   breath or difficulty breathing.  There is bright red blood coming from the drain.  You feel confused.  You have slurred speech.  You suddenly feel weak. MAKE SURE YOU:   Understand these instructions.  Will watch your condition.  Will get help right away if you are not doing well or get worse. Document Released:  08/06/2003 Document Revised: 04/18/2012 Document Reviewed: 05/14/2009 [facility] Patient Information 2015 [place], [place]. This information is not intended to replace advice given to you by your health care provider. Make sure you discuss any questions you have with your health care provider.

## 2013-07-04 LAB — CMP14+EGFR
ALT: 23 IU/L (ref 0–32)
AST: 24 IU/L (ref 0–40)
Albumin/Globulin Ratio: 1.6 (ref 1.1–2.5)
Albumin: 4.2 g/dL (ref 3.5–5.5)
Alkaline Phosphatase: 55 IU/L (ref 39–117)
BUN/Creatinine Ratio: 13 (ref 8–20)
BUN: 8 mg/dL (ref 6–20)
CO2: 21 mmol/L (ref 18–29)
Calcium: 8.8 mg/dL (ref 8.7–10.2)
Chloride: 100 mmol/L (ref 97–108)
Creatinine, Ser: 0.61 mg/dL (ref 0.57–1.00)
GFR calc Af Amer: 140 mL/min/{1.73_m2} (ref >59)
GFR calc non Af Amer: 121 mL/min/{1.73_m2} (ref >59)
Globulin, Total: 2.6 g/dL (ref 1.5–4.5)
Glucose: 86 mg/dL (ref 65–99)
Potassium: 4.3 mmol/L (ref 3.5–5.2)
Sodium: 137 mmol/L (ref 134–144)
Total Bilirubin: 0.3 mg/dL (ref 0.0–1.2)
Total Protein: 6.8 g/dL (ref 6.0–8.5)

## 2013-07-04 LAB — NMR, LIPOPROFILE
Cholesterol: 165 mg/dL (ref 100–199)
HDL Cholesterol by NMR: 36 mg/dL — ABNORMAL LOW (ref >39)
HDL Particle Number: 28.4 umol/L — ABNORMAL LOW (ref >=30.5)
LDL Particle Number: 1651 nmol/L — ABNORMAL HIGH (ref <1000)
LDL Size: 20.2 nm (ref >20.5)
LDLC SERPL CALC-MCNC: 103 mg/dL — ABNORMAL HIGH (ref 0–99)
LP-IR Score: 89 — ABNORMAL HIGH (ref <=45)
Small LDL Particle Number: 1095 nmol/L — ABNORMAL HIGH (ref <=527)
Triglycerides by NMR: 130 mg/dL (ref 0–149)

## 2013-07-17 ENCOUNTER — Telehealth: Payer: Self-pay | Admitting: Nurse Practitioner

## 2013-07-17 NOTE — Telephone Encounter (Signed)
ntbs

## 2013-07-17 NOTE — Telephone Encounter (Signed)
She has a prescription for Tylenol #3 but she hasn't needed it recently so it was removed from her med list at last visit.  She took one tablet on Saturday and it didn't help. She took a second pill and it causes anxiety and nausea.  Headache has improved to a level of 2 out of 10. She typically gets these when she is stressed. Do you have any suggestions?

## 2013-07-17 NOTE — Telephone Encounter (Signed)
I do not know what she is talking about- the only med on her list is fluid pill for HTN

## 2013-07-18 NOTE — Telephone Encounter (Signed)
Appt scheduled. Patient aware. 

## 2013-07-19 ENCOUNTER — Ambulatory Visit (INDEPENDENT_AMBULATORY_CARE_PROVIDER_SITE_OTHER): Payer: Medicaid Other | Admitting: Nurse Practitioner

## 2013-07-19 ENCOUNTER — Encounter: Payer: Self-pay | Admitting: Nurse Practitioner

## 2013-07-19 VITALS — BP 144/92 | HR 83 | Temp 98.1°F | Ht 61.0 in | Wt 255.6 lb

## 2013-07-19 DIAGNOSIS — G43809 Other migraine, not intractable, without status migrainosus: Secondary | ICD-10-CM

## 2013-07-19 NOTE — Patient Instructions (Signed)

## 2013-07-19 NOTE — Progress Notes (Signed)
   Subjective:    Patient ID: Denise Berger, female    DOB: August 31, 1980, 31 y.o.   MRN: [id number]  HPI Patient is here today for migraine headache. She has history of migraine headache with aura and is currently on tylenol #3 PRN. Her pain is a 2 out of 10 today. She lost her dog over the weekend which exacerbate her headaches. She denies any nausea and vomiting.    Review of Systems  Constitutional: Negative.   Eyes: Negative.   Respiratory: Negative.   Cardiovascular: Negative.   Gastrointestinal: Negative.   Endocrine: Negative.   Genitourinary: Negative.   Allergic/Immunologic: Negative.   Neurological: Positive for dizziness and headaches.  Hematological: Negative.   Psychiatric/Behavioral: Negative.        Objective:   Physical Exam  Constitutional: She is oriented to person, place, and time. She appears well-developed.  HENT:  Head: Normocephalic.  Eyes: Pupils are equal, round, and reactive to light.  Neck: Normal range of motion.  Cardiovascular: Normal rate.   Neurological: She is alert and oriented to person, place, and time.  Skin: Skin is warm.     BP 144/92  Pulse 83  Temp(Src) 98.1 F (36.7 C) (Oral)  Ht 5\' 1"  (1.549 m)  Wt 255 lb 9.6 oz (115.939 kg)  BMI 48.32 kg/m2  LMP 06/06/2013      Assessment & Plan:   1. Other migraine without status migrainosus, not intractable    Avoid caffeine Tylenol #3 as needed Rest rto prn  Mary-Margaret Daphine DeutscherMartin, FNP

## 2013-07-30 ENCOUNTER — Other Ambulatory Visit: Payer: Self-pay | Admitting: Nurse Practitioner

## 2013-10-26 ENCOUNTER — Encounter: Payer: Self-pay | Admitting: Nurse Practitioner

## 2013-10-26 ENCOUNTER — Ambulatory Visit (INDEPENDENT_AMBULATORY_CARE_PROVIDER_SITE_OTHER): Payer: Medicaid Other | Admitting: Nurse Practitioner

## 2013-10-26 VITALS — BP 146/91 | HR 85 | Temp 97.8°F | Ht 61.0 in | Wt 253.2 lb

## 2013-10-26 DIAGNOSIS — I10 Essential (primary) hypertension: Secondary | ICD-10-CM

## 2013-10-26 DIAGNOSIS — Z23 Encounter for immunization: Secondary | ICD-10-CM

## 2013-10-26 DIAGNOSIS — E785 Hyperlipidemia, unspecified: Secondary | ICD-10-CM | POA: Insufficient documentation

## 2013-10-26 MED ORDER — HYDROCHLOROTHIAZIDE 12.5 MG PO CAPS: 12.5000 mg | ORAL_CAPSULE | Freq: Every day | ORAL | Status: DC

## 2013-10-26 NOTE — Patient Instructions (Signed)
Fat and Cholesterol Control Diet Fat and cholesterol levels in your blood and organs are influenced by your diet. High levels of fat and cholesterol may lead to diseases of the heart, small and large blood vessels, gallbladder, liver, and pancreas. CONTROLLING FAT AND CHOLESTEROL WITH DIET Although exercise and lifestyle factors are important, your diet is key. That is because certain foods are known to raise cholesterol and others to lower it. The goal is to balance foods for their effect on cholesterol and more importantly, to replace saturated and trans fat with other types of fat, such as monounsaturated fat, polyunsaturated fat, and omega-3 fatty acids. On average, a person should consume no more than 15 to 17 g of saturated fat daily. Saturated and trans fats are considered "bad" fats, and they will raise LDL cholesterol. Saturated fats are primarily found in animal products such as meats, butter, and cream. However, that does not mean you need to give up all your favorite foods. Today, there are good tasting, low-fat, low-cholesterol substitutes for most of the things you like to eat. Choose low-fat or nonfat alternatives. Choose round or loin cuts of red meat. These types of cuts are lowest in fat and cholesterol. Chicken (without the skin), fish, veal, and ground turkey breast are great choices. Eliminate fatty meats, such as hot dogs and salami. Even shellfish have little or no saturated fat. Have a 3 oz (85 g) portion when you eat lean meat, poultry, or fish. Trans fats are also called "partially hydrogenated oils." They are oils that have been scientifically manipulated so that they are solid at room temperature resulting in a longer shelf life and improved taste and texture of foods in which they are added. Trans fats are found in stick margarine, some tub margarines, cookies, crackers, and baked goods.  When baking and cooking, oils are a great substitute for butter. The monounsaturated oils are  especially beneficial since it is believed they lower LDL and raise HDL. The oils you should avoid entirely are saturated tropical oils, such as coconut and palm.  Remember to eat a lot from food groups that are naturally free of saturated and trans fat, including fish, fruit, vegetables, beans, grains (barley, rice, couscous, bulgur wheat), and pasta (without cream sauces).  IDENTIFYING FOODS THAT LOWER FAT AND CHOLESTEROL  Soluble fiber may lower your cholesterol. This type of fiber is found in fruits such as apples, vegetables such as broccoli, potatoes, and carrots, legumes such as beans, peas, and lentils, and grains such as barley. Foods fortified with plant sterols (phytosterol) may also lower cholesterol. You should eat at least 2 g per day of these foods for a cholesterol lowering effect.  Read package labels to identify low-saturated fats, trans fat free, and low-fat foods at the supermarket. Select cheeses that have only 2 to 3 g saturated fat per ounce. Use a heart-healthy tub margarine that is free of trans fats or partially hydrogenated oil. When buying baked goods (cookies, crackers), avoid partially hydrogenated oils. Breads and muffins should be made from whole grains (whole-wheat or whole oat flour, instead of "flour" or "enriched flour"). Buy non-creamy canned soups with reduced salt and no added fats.  FOOD PREPARATION TECHNIQUES  Never deep-fry. If you must fry, either stir-fry, which uses very little fat, or use non-stick cooking sprays. When possible, broil, bake, or roast meats, and steam vegetables. Instead of putting butter or margarine on vegetables, use lemon and herbs, applesauce, and cinnamon (for squash and sweet potatoes). Use nonfat   yogurt, salsa, and low-fat dressings for salads.  LOW-SATURATED FAT / LOW-FAT FOOD SUBSTITUTES Meats / Saturated Fat (g)  Avoid: Steak, marbled (3 oz/85 g) / 11 g  Choose: Steak, lean (3 oz/85 g) / 4 g  Avoid: Hamburger (3 oz/85 g) / 7  g  Choose: Hamburger, lean (3 oz/85 g) / 5 g  Avoid: Ham (3 oz/85 g) / 6 g  Choose: Ham, lean cut (3 oz/85 g) / 2.4 g  Avoid: Chicken, with skin, dark meat (3 oz/85 g) / 4 g  Choose: Chicken, skin removed, dark meat (3 oz/85 g) / 2 g  Avoid: Chicken, with skin, light meat (3 oz/85 g) / 2.5 g  Choose: Chicken, skin removed, light meat (3 oz/85 g) / 1 g Dairy / Saturated Fat (g)  Avoid: Whole milk (1 cup) / 5 g  Choose: Low-fat milk, 2% (1 cup) / 3 g  Choose: Low-fat milk, 1% (1 cup) / 1.5 g  Choose: Skim milk (1 cup) / 0.3 g  Avoid: Hard cheese (1 oz/28 g) / 6 g  Choose: Skim milk cheese (1 oz/28 g) / 2 to 3 g  Avoid: Cottage cheese, 4% fat (1 cup) / 6.5 g  Choose: Low-fat cottage cheese, 1% fat (1 cup) / 1.5 g  Avoid: Ice cream (1 cup) / 9 g  Choose: Sherbet (1 cup) / 2.5 g  Choose: Nonfat frozen yogurt (1 cup) / 0.3 g  Choose: Frozen fruit bar / trace  Avoid: Whipped cream (1 tbs) / 3.5 g  Choose: Nondairy whipped topping (1 tbs) / 1 g Condiments / Saturated Fat (g)  Avoid: Mayonnaise (1 tbs) / 2 g  Choose: Low-fat mayonnaise (1 tbs) / 1 g  Avoid: Butter (1 tbs) / 7 g  Choose: Extra light margarine (1 tbs) / 1 g  Avoid: Coconut oil (1 tbs) / 11.8 g  Choose: Olive oil (1 tbs) / 1.8 g  Choose: Corn oil (1 tbs) / 1.7 g  Choose: Safflower oil (1 tbs) / 1.2 g  Choose: Sunflower oil (1 tbs) / 1.4 g  Choose: Soybean oil (1 tbs) / 2.4 g  Choose: Canola oil (1 tbs) / 1 g Document Released: 12/22/2004 Document Revised: 04/18/2012 Document Reviewed: 03/22/2013 [employer] Patient Information 2015 [employer]. This information is not intended to replace advice given to you by your health care provider. Make sure you discuss any questions you have with your health care provider.  

## 2013-10-26 NOTE — Progress Notes (Signed)
   Subjective:    Patient ID: Denise Berger, female    DOB: 1980-01-08, 33 y.o.   MRN: [id number]  Patient here today for follow up of chronic medical problems.   Hypertension This is a chronic problem. The current episode started more than 1 month ago. The problem has been resolved since onset. The problem is controlled. Pertinent negatives include no blurred vision, headaches, neck pain, palpitations, peripheral edema or shortness of breath. There are no associated agents to hypertension. Risk factors for coronary artery disease include stress, obesity, dyslipidemia, diabetes mellitus and family history. Past treatments include diuretics. The current treatment provides moderate improvement. Compliance problems include diet and exercise.   Hyperlipidemia This is a chronic problem. The current episode started more than 1 year ago. The problem is uncontrolled. Recent lipid tests were reviewed and are high. Exacerbating diseases include obesity. She has no history of diabetes or hypothyroidism. Pertinent negatives include no shortness of breath. Current antihyperlipidemic treatment includes diet change. Compliance problems include adherence to diet and adherence to exercise.  Risk factors for coronary artery disease include dyslipidemia, hypertension and obesity.      Review of Systems  Eyes: Negative for blurred vision.  Respiratory: Negative for shortness of breath.   Cardiovascular: Negative for palpitations.  Musculoskeletal: Negative for neck pain.  Neurological: Negative for headaches.       Objective:   Physical Exam  Constitutional: She is oriented to person, place, and time. She appears well-developed and well-nourished.  HENT:  Nose: Nose normal.  Mouth/Throat: Oropharynx is clear and moist.  Eyes: EOM are normal.  Neck: Trachea normal, normal range of motion and full passive range of motion without pain. Neck supple. No JVD present. Carotid bruit is not present. No thyromegaly  present.  Cardiovascular: Normal rate, regular rhythm, normal heart sounds and intact distal pulses.  Exam reveals no gallop and no friction rub.   No murmur heard. Pulmonary/Chest: Effort normal and breath sounds normal.  Abdominal: Soft. Bowel sounds are normal. She exhibits no distension and no mass. There is no tenderness.  Musculoskeletal: Normal range of motion.  Lymphadenopathy:    She has no cervical adenopathy.  Neurological: She is alert and oriented to person, place, and time. She has normal reflexes.  Skin: Skin is warm and dry.  Psychiatric: She has a normal mood and affect. Her behavior is normal. Judgment and thought content normal.   BP 146/91  Pulse 85  Temp(Src) 97.8 F (36.6 C) (Oral)  Ht _0  (1.549 m)  Wt 253 lb 3.2 oz (114.851 kg)  BMI 47.87 kg/m2         Assessment & Plan:   1. Essential hypertension, benign Low NA+ diet - hydrochlorothiazide (MICROZIDE) 12.5 MG capsule; Take 1 capsule (12.5 mg total) by mouth daily.  Dispense: 30 capsule; Refill: 5  2. Hyperlipidemia with target LDL less than 100 Low fat diet If cholesterol is still high may need to add meds  Orders Placed This Encounter  Procedures  . CMP14+EGFR  . NMR, lipoprofile     Patient will schedule PAP Labs pending Health maintenance reviewed Diet and exercise encouraged Continue all meds Follow up  In 3 month   [place], FNP

## 2013-10-27 LAB — NMR, LIPOPROFILE
Cholesterol: 164 mg/dL (ref 100–199)
HDL Cholesterol by NMR: 34 mg/dL — ABNORMAL LOW (ref >39)
HDL Particle Number: 24.8 umol/L — ABNORMAL LOW (ref >=30.5)
LDL Particle Number: 1444 nmol/L — ABNORMAL HIGH (ref <1000)
LDL Size: 20.5 nm (ref >20.5)
LDLC SERPL CALC-MCNC: 94 mg/dL (ref 0–99)
LP-IR Score: 85 — ABNORMAL HIGH (ref <=45)
Small LDL Particle Number: 779 nmol/L — ABNORMAL HIGH (ref <=527)
Triglycerides by NMR: 182 mg/dL — ABNORMAL HIGH (ref 0–149)

## 2013-10-27 LAB — CMP14+EGFR
ALT: 18 IU/L (ref 0–32)
AST: 15 IU/L (ref 0–40)
Albumin/Globulin Ratio: 1.6 (ref 1.1–2.5)
Albumin: 4.2 g/dL (ref 3.5–5.5)
Alkaline Phosphatase: 53 IU/L (ref 39–117)
BUN/Creatinine Ratio: 10 (ref 8–20)
BUN: 7 mg/dL (ref 6–20)
CO2: 25 mmol/L (ref 18–29)
Calcium: 9.1 mg/dL (ref 8.7–10.2)
Chloride: 97 mmol/L (ref 97–108)
Creatinine, Ser: 0.72 mg/dL (ref 0.57–1.00)
GFR calc Af Amer: 129 mL/min/{1.73_m2} (ref >59)
GFR calc non Af Amer: 112 mL/min/{1.73_m2} (ref >59)
Globulin, Total: 2.7 g/dL (ref 1.5–4.5)
Glucose: 115 mg/dL — ABNORMAL HIGH (ref 65–99)
Potassium: 3.9 mmol/L (ref 3.5–5.2)
Sodium: 135 mmol/L (ref 134–144)
Total Bilirubin: 0.3 mg/dL (ref 0.0–1.2)
Total Protein: 6.9 g/dL (ref 6.0–8.5)

## 2014-01-23 ENCOUNTER — Ambulatory Visit (INDEPENDENT_AMBULATORY_CARE_PROVIDER_SITE_OTHER): Payer: Medicaid Other | Admitting: Family Medicine

## 2014-01-23 ENCOUNTER — Encounter: Payer: Self-pay | Admitting: Family Medicine

## 2014-01-23 VITALS — BP 160/105 | HR 94 | Temp 97.9°F | Ht 61.0 in | Wt 247.0 lb

## 2014-01-23 DIAGNOSIS — J069 Acute upper respiratory infection, unspecified: Secondary | ICD-10-CM

## 2014-01-23 MED ORDER — BENZONATATE 200 MG PO CAPS: 200.0000 mg | ORAL_CAPSULE | Freq: Two times a day (BID) | ORAL | Status: DC | PRN

## 2014-01-23 MED ORDER — AZITHROMYCIN 250 MG PO TABS: ORAL_TABLET | ORAL | Status: DC

## 2014-01-23 NOTE — Progress Notes (Signed)
   Subjective:    Patient ID: Denise Berger, female    DOB: 1980-01-10, 34 y.o.   MRN: [id number]  HPI   Review of Systems  Constitutional: Negative for fever.  HENT: Negative for ear pain.   Eyes: Negative for discharge.  Respiratory: Negative for cough.   Cardiovascular: Negative for chest pain.  Gastrointestinal: Negative for abdominal distention.  Endocrine: Negative for polyuria.  Genitourinary: Negative for difficulty urinating.  Musculoskeletal: Negative for gait problem and neck pain.  Skin: Negative for color change and rash.  Neurological: Negative for speech difficulty and headaches.  Psychiatric/Behavioral: Negative for agitation.       Objective:    BP 160/105 mmHg  Pulse 94  Temp(Src) 97.9 F (36.6 C) (Oral)  Ht 5\' 1"  (1.549 m)  Wt 247 lb (112.038 kg)  BMI 46.69 kg/m2 Physical Exam  Constitutional: She is oriented to person, place, and time. She appears well-developed and well-nourished.  HENT:  Head: Normocephalic and atraumatic.  Mouth/Throat: Oropharynx is clear and moist.  Eyes: Pupils are equal, round, and reactive to light.  Neck: Normal range of motion. Neck supple.  Cardiovascular: Normal rate and regular rhythm.   No murmur heard. Pulmonary/Chest: Effort normal and breath sounds normal.  Abdominal: Soft. Bowel sounds are normal. There is no tenderness.  Neurological: She is alert and oriented to person, place, and time.  Skin: Skin is warm and dry.  Psychiatric: She has a normal mood and affect.          Assessment & Plan:     ICD-9-CM ICD-10-CM   1. URI (upper respiratory infection) 465.9 J06.9 azithromycin (ZITHROMAX) 250 MG tablet     benzonatate (TESSALON) 200 MG capsule   Push po fluids, rest, tylenol and motrin otc prn as directed for fever, arthralgias, and myalgias.  Follow up prn if sx's continue or persist.  No Follow-up on file.  Deatra CanterWilliam J Nickalas Mccarrick FNP

## 2014-01-26 ENCOUNTER — Ambulatory Visit: Payer: Medicaid Other | Admitting: Nurse Practitioner

## 2014-02-14 ENCOUNTER — Ambulatory Visit (INDEPENDENT_AMBULATORY_CARE_PROVIDER_SITE_OTHER): Payer: Medicaid Other | Admitting: Nurse Practitioner

## 2014-02-14 ENCOUNTER — Encounter: Payer: Self-pay | Admitting: Nurse Practitioner

## 2014-02-14 VITALS — BP 121/74 | HR 94 | Temp 97.3°F | Ht 61.0 in | Wt 246.0 lb

## 2014-02-14 DIAGNOSIS — G43809 Other migraine, not intractable, without status migrainosus: Secondary | ICD-10-CM

## 2014-02-14 DIAGNOSIS — I10 Essential (primary) hypertension: Secondary | ICD-10-CM

## 2014-02-14 DIAGNOSIS — E785 Hyperlipidemia, unspecified: Secondary | ICD-10-CM

## 2014-02-14 MED ORDER — HYDROCHLOROTHIAZIDE 12.5 MG PO CAPS: 12.5000 mg | ORAL_CAPSULE | Freq: Every day | ORAL | Status: DC

## 2014-02-14 NOTE — Progress Notes (Addendum)
   Subjective:    Patient ID: Denise Berger, female    DOB: 1980-09-22, 34 y.o.   MRN: [id number] Patient is here of chronic disease follow up.    Hypertension This is a chronic problem. The current episode started more than 1 year ago. The problem is controlled. Associated symptoms include headaches (migraine headache. ). Pertinent negatives include no blurred vision, chest pain or neck pain. There are no associated agents to hypertension. Risk factors for coronary artery disease include dyslipidemia and obesity. Past treatments include diuretics. There are no compliance problems.   Hyperlipidemia This is a chronic problem. The current episode started more than 1 year ago. Pertinent negatives include no chest pain. She is currently on no antihyperlipidemic treatment. Risk factors for coronary artery disease include dyslipidemia, hypertension and obesity.  Migraine headache:  She reports taking Excedrin rapid release which helps. She reports Her migraines have been better since started on the bp medications.    *patient reports feeling dizzy, cool and clammy once in a while. She reports taking something sweet the feeling goes away.   Review of Systems  Constitutional: Negative.   Eyes: Negative.  Negative for blurred vision.  Respiratory: Negative.   Cardiovascular: Negative.  Negative for chest pain.  Gastrointestinal: Negative.   Endocrine: Negative.   Genitourinary: Negative.   Musculoskeletal: Negative.  Negative for neck pain.  Skin: Negative.   Allergic/Immunologic: Negative.   Neurological: Positive for headaches (migraine headache. ).  Hematological: Negative.   Psychiatric/Behavioral: Negative.        Objective:   Physical Exam  Constitutional: She is oriented to person, place, and time.  HENT:  Head: Normocephalic.  Eyes: Pupils are equal, round, and reactive to light.  Neck: Normal range of motion.  Cardiovascular: Normal rate.   Murmur (2/6 systolic heard loudest at  pilmonic valve.) heard. Pulmonary/Chest: Effort normal.  Musculoskeletal: Normal range of motion.  Neurological: She is alert and oriented to person, place, and time.  Skin: Skin is warm.  Psychiatric: She has a normal mood and affect. Her behavior is normal. Judgment and thought content normal.     BP 121/74 mmHg  Pulse 94  Temp(Src) 97.3 F (36.3 C) (Oral)  Ht $R'5\' 1"'mX$  (1.549 m)  Wt 246 lb (111.585 kg)  BMI 46.51 kg/m2      Assessment & Plan:  1. Essential hypertension, benign Do not salt to diet - CMP14+EGFR - hydrochlorothiazide (MICROZIDE) 12.5 MG capsule; Take 1 capsule (12.5 mg total) by mouth daily.  Dispense: 30 capsule; Refill: 5  2. Other migraine without status migrainosus, not intractable Avoid caffeine Keep diary of hedaches  3. Hyperlipidemia with target LDL less than 100 Low fta diet - NMR, lipoprofile    Labs pending Health maintenance reviewed Diet and exercise encouraged Continue all meds Follow up  In 3 months   [place], FNP

## 2014-02-14 NOTE — Patient Instructions (Signed)

## 2014-02-15 ENCOUNTER — Telehealth: Payer: Self-pay | Admitting: *Deleted

## 2014-02-15 LAB — CMP14+EGFR
ALT: 16 IU/L (ref 0–32)
AST: 21 IU/L (ref 0–40)
Albumin/Globulin Ratio: 1.6 (ref 1.1–2.5)
Albumin: 4.4 g/dL (ref 3.5–5.5)
Alkaline Phosphatase: 58 IU/L (ref 39–117)
BUN/Creatinine Ratio: 16 (ref 8–20)
BUN: 10 mg/dL (ref 6–20)
Bilirubin Total: <0.2 mg/dL (ref 0.0–1.2)
CO2: 26 mmol/L (ref 18–29)
Calcium: 9.3 mg/dL (ref 8.7–10.2)
Chloride: 97 mmol/L (ref 97–108)
Creatinine, Ser: 0.64 mg/dL (ref 0.57–1.00)
GFR calc Af Amer: 138 mL/min/{1.73_m2} (ref >59)
GFR calc non Af Amer: 119 mL/min/{1.73_m2} (ref >59)
Globulin, Total: 2.8 g/dL (ref 1.5–4.5)
Glucose: 94 mg/dL (ref 65–99)
Potassium: 4.4 mmol/L (ref 3.5–5.2)
Sodium: 138 mmol/L (ref 134–144)
Total Protein: 7.2 g/dL (ref 6.0–8.5)

## 2014-02-15 LAB — NMR, LIPOPROFILE
Cholesterol: 180 mg/dL (ref 100–199)
HDL Cholesterol by NMR: 34 mg/dL — ABNORMAL LOW (ref >39)
HDL Particle Number: 29.2 umol/L — ABNORMAL LOW (ref >=30.5)
LDL Particle Number: 1668 nmol/L — ABNORMAL HIGH (ref <1000)
LDL Size: 20.3 nm (ref >20.5)
LDL-C: 105 mg/dL — ABNORMAL HIGH (ref 0–99)
LP-IR Score: 84 — ABNORMAL HIGH (ref <=45)
Small LDL Particle Number: 991 nmol/L — ABNORMAL HIGH (ref <=527)
Triglycerides by NMR: 206 mg/dL — ABNORMAL HIGH (ref 0–149)

## 2014-02-15 NOTE — Telephone Encounter (Signed)
lmtcb regarding test results. 

## 2014-02-15 NOTE — Telephone Encounter (Signed)
-----   [facility] from [facility]Mary-Margaret Martin, FNP sent at 02/15/2014  8:27 AM EST ----- Kidney and liver function stable LDL particle numbers- ldl and trig are elevated Strict low fta diet and exercise- if no better in 6 months will need to put on statin

## 2014-02-16 ENCOUNTER — Encounter: Payer: Self-pay | Admitting: Nurse Practitioner

## 2014-04-04 ENCOUNTER — Ambulatory Visit: Payer: Medicaid Other | Admitting: Nurse Practitioner

## 2014-04-16 ENCOUNTER — Ambulatory Visit (INDEPENDENT_AMBULATORY_CARE_PROVIDER_SITE_OTHER): Payer: Medicaid Other | Admitting: Family

## 2014-04-16 ENCOUNTER — Encounter: Payer: Self-pay | Admitting: Family

## 2014-04-16 VITALS — BP 113/72 | HR 88 | Temp 97.8°F | Ht 61.0 in | Wt 245.6 lb

## 2014-04-16 DIAGNOSIS — J069 Acute upper respiratory infection, unspecified: Secondary | ICD-10-CM | POA: Diagnosis not present

## 2014-04-16 MED ORDER — AZITHROMYCIN 250 MG PO TABS: ORAL_TABLET | ORAL | Status: DC

## 2014-04-16 MED ORDER — BENZONATATE 200 MG PO CAPS: 200.0000 mg | ORAL_CAPSULE | Freq: Three times a day (TID) | ORAL | Status: DC | PRN

## 2014-04-16 NOTE — Progress Notes (Signed)
Subjective:    Patient ID: Denise Berger, female    DOB: [birth year], 34 y.o.   MRN: [id number]  Otalgia  There is pain in both ears. This is a new problem. The current episode started 1 to 4 weeks ago. The problem occurs hourly. The problem has been gradually worsening. There has been no fever. The pain is at a severity of 2/10. The pain is mild. Associated symptoms include coughing, headaches, hearing loss, neck pain, rhinorrhea and a sore throat. Pertinent negatives include no diarrhea, ear discharge, rash or vomiting. Treatments tried: Cough OTC, tylenol sinus, and benadryl. The treatment provided mild relief. There is no history of a chronic ear infection.      Review of Systems  Constitutional: Negative.   HENT: Positive for ear pain, hearing loss, rhinorrhea and sore throat. Negative for ear discharge.   Eyes: Negative.   Respiratory: Positive for cough. Negative for shortness of breath.   Cardiovascular: Negative.  Negative for palpitations.  Gastrointestinal: Negative.  Negative for vomiting and diarrhea.  Endocrine: Negative.   Genitourinary: Negative.   Musculoskeletal: Positive for neck pain.  Skin: Negative for rash.  Neurological: Positive for headaches.  Hematological: Negative.   Psychiatric/Behavioral: Negative.   All other systems reviewed and are negative.      Objective:   Physical Exam  Constitutional: She is oriented to person, place, and time. She appears well-developed and well-nourished. No distress.  HENT:  Head: Normocephalic and atraumatic.  Right Ear: External ear normal.  Left Ear: External ear normal.  Nasal passage erythemas with mild swelling  Oropharynx erythemas   Eyes: Pupils are equal, round, and reactive to light.  Neck: Normal range of motion. Neck supple. No thyromegaly present.  Cardiovascular: Normal rate, regular rhythm, normal heart sounds and intact distal pulses.   No murmur heard. Pulmonary/Chest: Effort normal and breath sounds  normal. No respiratory distress. She has no wheezes.  Abdominal: Soft. Bowel sounds are normal. She exhibits no distension. There is no tenderness.  Musculoskeletal: Normal range of motion. She exhibits no edema or tenderness.  Neurological: She is alert and oriented to person, place, and time. She has normal reflexes. No cranial nerve deficit.  Skin: Skin is warm and dry.  Psychiatric: She has a normal mood and affect. Her behavior is normal. Judgment and thought content normal.  Vitals reviewed.    BP 113/72 mmHg  Pulse 88  Temp(Src) 97.8 F (36.6 C) (Oral)  Ht  (1.549 m)  Wt 245 lb 9.6 oz (111.403 kg)  BMI 46.43 kg/m2      Assessment & Plan:  1. Acute upper respiratory infection -- Take meds as prescribed - Use a cool mist humidifier  -Use saline nose sprays frequently -Saline irrigations of the nose can be very helpful if done frequently.  * 4X daily for 1 week*  * Use of a nettie pot can be helpful with this. Follow directions with this* -Force fluids -For any cough or congestion  Use plain Mucinex- regular strength or max strength is fine   * Children- consult with Pharmacist for dosing -For fever or aces or pains- take tylenol or ibuprofen appropriate for age and weight.  * for fevers greater than 101 orally you may alternate ibuprofen and tylenol every  3 hours. -Throat lozenges if hel - azithromycin (ZITHROMAX) 250 MG tablet; Take 500 mg once, then 250 mg for four days  Dispense: 6 tablet; Refill: 0 - benzonatate (TESSALON) 200 MG capsule; Take 1 capsule (200 mg  total) by mouth 3 (three) times daily as needed.  Dispense: 30 capsule; Refill: 1  Jannifer Rodneyhristy Aleecia Tapia, FNP

## 2014-04-16 NOTE — Patient Instructions (Signed)
Upper Respiratory Infection, Adult An upper respiratory infection (URI) is also sometimes known as the common cold. The upper respiratory tract includes the nose, sinuses, throat, trachea, and bronchi. Bronchi are the airways leading to the lungs. Most people improve within 1 week, but symptoms can last up to 2 weeks. A residual cough may last even longer.  CAUSES Many different viruses can infect the tissues lining the upper respiratory tract. The tissues become irritated and inflamed and often become very moist. Mucus production is also common. A cold is contagious. You can easily spread the virus to others by oral contact. This includes kissing, sharing a glass, coughing, or sneezing. Touching your mouth or nose and then touching a surface, which is then touched by another person, can also spread the virus. SYMPTOMS  Symptoms typically develop 1 to 3 days after you come in contact with a cold virus. Symptoms vary from person to person. They may include:  Runny nose.  Sneezing.  Nasal congestion.  Sinus irritation.  Sore throat.  Loss of voice (laryngitis).  Cough.  Fatigue.  Muscle aches.  Loss of appetite.  Headache.  Low-grade fever. DIAGNOSIS  You might diagnose your own cold based on familiar symptoms, since most people get a cold 2 to 3 times a year. Your caregiver can confirm this based on your exam. Most importantly, your caregiver can check that your symptoms are not due to another disease such as strep throat, sinusitis, pneumonia, asthma, or epiglottitis. Blood tests, throat tests, and X-rays are not necessary to diagnose a common cold, but they may sometimes be helpful in excluding other more serious diseases. Your caregiver will decide if any further tests are required. RISKS AND COMPLICATIONS  You may be at risk for a more severe case of the common cold if you smoke cigarettes, have chronic heart disease (such as heart failure) or lung disease (such as asthma), or if  you have a weakened immune system. The very young and very old are also at risk for more serious infections. Bacterial sinusitis, middle ear infections, and bacterial pneumonia can complicate the common cold. The common cold can worsen asthma and chronic obstructive pulmonary disease (COPD). Sometimes, these complications can require emergency medical care and may be life-threatening. PREVENTION  The best way to protect against getting a cold is to practice good hygiene. Avoid oral or hand contact with people with cold symptoms. Wash your hands often if contact occurs. There is no clear evidence that vitamin C, vitamin E, echinacea, or exercise reduces the chance of developing a cold. However, it is always recommended to get plenty of rest and practice good nutrition. TREATMENT  Treatment is directed at relieving symptoms. There is no cure. Antibiotics are not effective, because the infection is caused by a virus, not by bacteria. Treatment may include:  Increased fluid intake. Sports drinks offer valuable electrolytes, sugars, and fluids.  Breathing heated mist or steam (vaporizer or shower).  Eating chicken soup or other clear broths, and maintaining good nutrition.  Getting plenty of rest.  Using gargles or lozenges for comfort.  Controlling fevers with ibuprofen or acetaminophen as directed by your caregiver.  Increasing usage of your inhaler if you have asthma. Zinc gel and zinc lozenges, taken in the first 24 hours of the common cold, can shorten the duration and lessen the severity of symptoms. Pain medicines may help with fever, muscle aches, and throat pain. A variety of non-prescription medicines are available to treat congestion and runny nose. Your caregiver   can make recommendations and may suggest nasal or lung inhalers for other symptoms.  HOME CARE INSTRUCTIONS   Only take over-the-counter or prescription medicines for pain, discomfort, or fever as directed by your  caregiver.  Use a warm mist humidifier or inhale steam from a shower to increase air moisture. This may keep secretions moist and make it easier to breathe.  Drink enough water and fluids to keep your urine clear or pale yellow.  Rest as needed.  Return to work when your temperature has returned to normal or as your caregiver advises. You may need to stay home longer to avoid infecting others. You can also use a face mask and careful hand washing to prevent spread of the virus. SEEK MEDICAL CARE IF:   After the first few days, you feel you are getting worse rather than better.  You need your caregiver's advice about medicines to control symptoms.  You develop chills, worsening shortness of breath, or brown or red sputum. These may be signs of pneumonia.  You develop yellow or brown nasal discharge or pain in the face, especially when you bend forward. These may be signs of sinusitis.  You develop a fever, swollen neck glands, pain with swallowing, or white areas in the back of your throat. These may be signs of strep throat. SEEK IMMEDIATE MEDICAL CARE IF:   You have a fever.  You develop severe or persistent headache, ear pain, sinus pain, or chest pain.  You develop wheezing, a prolonged cough, cough up blood, or have a change in your usual mucus (if you have chronic lung disease).  You develop sore muscles or a stiff neck. Document Released: 06/17/2000 Document Revised: 03/16/2011 Document Reviewed: 03/29/2013 Exit[employer] Patient Information 2015 [employer]. This information is not intended to replace advice given to you by your health care provider. Make sure you discuss any questions you have with your health care provider.  - Take meds as prescribed - Use a cool mist humidifier  -Use saline nose sprays frequently -Saline irrigations of the nose can be very helpful if done frequently.  * 4X daily for 1 week*  * Use of a nettie pot can be helpful with this. Follow  directions with this* -Force fluids -For any cough or congestion  Use plain Mucinex- regular strength or max strength is fine   * Children- consult with Pharmacist for dosing -For fever or aces or pains- take tylenol or ibuprofen appropriate for age and weight.  * for fevers greater than 101 orally you may alternate ibuprofen and tylenol every  3 hours. -Throat lozenges if help   Raymondo Garcialopez, FNP   

## 2014-05-17 ENCOUNTER — Ambulatory Visit: Payer: Medicaid Other | Admitting: Nurse Practitioner

## 2014-06-28 ENCOUNTER — Ambulatory Visit: Payer: Medicaid Other | Admitting: Physician Assistant

## 2014-07-02 ENCOUNTER — Encounter: Payer: Self-pay | Admitting: Nurse Practitioner

## 2014-11-19 ENCOUNTER — Other Ambulatory Visit: Payer: Self-pay | Admitting: Nurse Practitioner

## 2014-12-10 ENCOUNTER — Encounter: Payer: Self-pay | Admitting: Family Medicine

## 2014-12-10 ENCOUNTER — Ambulatory Visit (INDEPENDENT_AMBULATORY_CARE_PROVIDER_SITE_OTHER): Payer: Medicaid Other | Admitting: Family Medicine

## 2014-12-10 VITALS — BP 153/95 | HR 79 | Temp 98.2°F | Ht 61.0 in | Wt 245.6 lb

## 2014-12-10 DIAGNOSIS — J069 Acute upper respiratory infection, unspecified: Secondary | ICD-10-CM | POA: Diagnosis not present

## 2014-12-10 MED ORDER — FLUTICASONE PROPIONATE 50 MCG/ACT NA SUSP: 1.0000 | Freq: Two times a day (BID) | NASAL | Status: DC | PRN

## 2014-12-10 MED ORDER — AZITHROMYCIN 250 MG PO TABS: ORAL_TABLET | ORAL | Status: DC

## 2014-12-10 NOTE — Progress Notes (Signed)
BP 153/95 mmHg  Pulse 79  Temp(Src) 98.2 F (36.8 C) (Oral)  Ht '5\' 1"'  (1.549 m)  Wt 245 lb 9.6 oz (111.403 kg)  BMI 46.43 kg/m2  LMP 11/23/2014 (Approximate)   Subjective:    Patient ID: Denise Berger, female    DOB: 03/09/1980, 34 y.o.   MRN: [id number]  HPI: Denise Berger is a 34 y.o. female presenting on 12/10/2014 for Left ear pain   HPI Earache and nasal congestion Patient has been having an earache and nasal congestion for the past week. She feels like her ear is muffled on the left side and can't hear out of it very well. She has used some over-the-counter drops to help clear it up but they do not seem to be helping. She denies any fevers or chills. She does admit to having nasal congestion that is especially worse when she wakes up early in the morning. She also has nasal sinus pressure.  Relevant past medical, surgical, family and social history reviewed and updated as indicated. Interim medical history since our last visit reviewed. Allergies and medications reviewed and updated.  Review of Systems  Constitutional: Negative for fever and chills.  HENT: Positive for postnasal drip, sinus pressure and sore throat. Negative for congestion, ear discharge, ear pain, rhinorrhea and sneezing.   Eyes: Negative for pain, redness and visual disturbance.  Respiratory: Negative for cough, chest tightness and shortness of breath.   Cardiovascular: Negative for chest pain and leg swelling.  Genitourinary: Negative for dysuria and difficulty urinating.  Musculoskeletal: Negative for back pain and gait problem.  Skin: Negative for rash.  Neurological: Negative for light-headedness and headaches.  Psychiatric/Behavioral: Negative for behavioral problems and agitation.  All other systems reviewed and are negative.   Per HPI unless specifically indicated above     Medication List       This list is accurate as of: 12/10/14  4:32 PM.  Always use your most recent med list.             azithromycin 250 MG tablet  Commonly known as:  ZITHROMAX  Take 500 mg once, then 250 mg for four days     fluticasone 50 MCG/ACT nasal spray  Commonly known as:  FLONASE  Place 1 spray into both nostrils 2 (two) times daily as needed for allergies or rhinitis.     hydrochlorothiazide 12.5 MG capsule  Commonly known as:  MICROZIDE  TAKE 1 CAPSULE (12.5 MG TOTAL) BY MOUTH DAILY.           Objective:    BP 153/95 mmHg  Pulse 79  Temp(Src) 98.2 F (36.8 C) (Oral)  Ht '5\' 1"'  (1.549 m)  Wt 245 lb 9.6 oz (111.403 kg)  BMI 46.43 kg/m2  LMP 11/23/2014 (Approximate)  Wt Readings from Last 3 Encounters:  12/10/14 245 lb 9.6 oz (111.403 kg)  04/16/14 245 lb 9.6 oz (111.403 kg)  02/14/14 246 lb (111.585 kg)    Physical Exam  Constitutional: She is oriented to person, place, and time. She appears well-developed and well-nourished. No distress.  HENT:  Right Ear: Tympanic membrane, external ear and ear canal normal.  Left Ear: External ear and ear canal normal. No drainage, swelling or tenderness. Tympanic membrane is bulging. Tympanic membrane is not injected and not perforated.  Nose: Mucosal edema and rhinorrhea present. No epistaxis. Right sinus exhibits no maxillary sinus tenderness and no frontal sinus tenderness. Left sinus exhibits no maxillary sinus tenderness and no frontal sinus tenderness.  Mouth/Throat:  Uvula is midline and mucous membranes are normal. Posterior oropharyngeal edema and posterior oropharyngeal erythema present. No oropharyngeal exudate or tonsillar abscesses.  Eyes: Conjunctivae and EOM are normal.  Cardiovascular: Normal rate, regular rhythm, normal heart sounds and intact distal pulses.   No murmur heard. Pulmonary/Chest: Effort normal and breath sounds normal. No respiratory distress. She has no wheezes.  Musculoskeletal: Normal range of motion. She exhibits no edema or tenderness.  Neurological: She is alert and oriented to person, place, and time.  Coordination normal.  Skin: Skin is warm and dry. No rash noted. She is not diaphoretic.  Psychiatric: She has a normal mood and affect. Her behavior is normal.  Vitals reviewed.   Results for orders placed or performed in visit on 02/14/14  CMP14+EGFR  Result Value Ref Range   Glucose 94 65 - 99 mg/dL   BUN 10 6 - 20 mg/dL   Creatinine, Ser 0.64 0.57 - 1.00 mg/dL   GFR calc non Af Amer 119 >59 mL/min/1.73   GFR calc Af Amer 138 >59 mL/min/1.73   BUN/Creatinine Ratio 16 8 - 20   Sodium 138 134 - 144 mmol/L   Potassium 4.4 3.5 - 5.2 mmol/L   Chloride 97 97 - 108 mmol/L   CO2 26 18 - 29 mmol/L   Calcium 9.3 8.7 - 10.2 mg/dL   Total Protein 7.2 6.0 - 8.5 g/dL   Albumin 4.4 3.5 - 5.5 g/dL   Globulin, Total 2.8 1.5 - 4.5 g/dL   Albumin/Globulin Ratio 1.6 1.1 - 2.5   Bilirubin Total <0.2 0.0 - 1.2 mg/dL   Alkaline Phosphatase 58 39 - 117 IU/L   AST 21 0 - 40 IU/L   ALT 16 0 - 32 IU/L  NMR, lipoprofile  Result Value Ref Range   LDL Particle Number 1668 (H) <1000 nmol/L   LDL-C 105 (H) 0 - 99 mg/dL   HDL Cholesterol by NMR 34 (L) >39 mg/dL   Triglycerides by NMR 206 (H) 0 - 149 mg/dL   Cholesterol 180 100 - 199 mg/dL   HDL Particle Number 29.2 (L) >=30.5 umol/L   Small LDL Particle Number 991 (H) <=527 nmol/L   LDL Size 20.3 >20.5 nm   LP-IR Score 84 (H) <=45      Assessment & Plan:   Problem List Items Addressed This Visit    None    Visit Diagnoses    Acute upper respiratory infection    -  Primary    Relevant Medications    azithromycin (ZITHROMAX) 250 MG tablet    fluticasone (FLONASE) 50 MCG/ACT nasal spray        Follow up plan: Return in about 4 weeks (around 01/07/2015), or if symptoms worsen or fail to improve, for Hypertension.  Counseling provided for all of the vaccine components No orders of the defined types were placed in this encounter.    Caryl Pina, MD [facility] 12/10/2014, 4:32 PM

## 2014-12-14 ENCOUNTER — Telehealth: Payer: Self-pay | Admitting: Family Medicine

## 2014-12-14 NOTE — Telephone Encounter (Signed)
Azithromycin works in your system for 10 days even though he only take it for 5. Use Flonase and an antihistamine and if still not improved in 2 or 3 more days give us a call back. The antibiotic that you are taking is still working even though he took the last pill because it stays in your system for some much longer.

## 2014-12-20 ENCOUNTER — Other Ambulatory Visit: Payer: Self-pay | Admitting: Family

## 2015-02-19 ENCOUNTER — Encounter: Payer: Self-pay | Admitting: Pediatrics

## 2015-02-19 ENCOUNTER — Ambulatory Visit (INDEPENDENT_AMBULATORY_CARE_PROVIDER_SITE_OTHER): Payer: Medicaid Other | Admitting: Pediatrics

## 2015-02-19 VITALS — BP 130/95 | HR 86 | Temp 97.4°F | Ht 61.0 in | Wt 246.8 lb

## 2015-02-19 DIAGNOSIS — J019 Acute sinusitis, unspecified: Secondary | ICD-10-CM

## 2015-02-19 MED ORDER — DOXYCYCLINE HYCLATE 100 MG PO TABS: 100.0000 mg | ORAL_TABLET | Freq: Two times a day (BID) | ORAL | Status: DC

## 2015-02-19 NOTE — Progress Notes (Signed)
Subjective:    Patient ID: Denise Berger, female    DOB: 04/06/1980, 35 y.o.   MRN: [id number]  CC: Cough; Nasal Congestion; and Facial Pain   HPI: Denise Berger is a 35 y.o. female presenting for Cough; Nasal Congestion; and Facial Pain  Past two weeks having pain, sharp pains from nostril up to eyes, mostly on L side Sharp pains inside nostril that shoot up her nose No nose bleeds Ongoing yellow-green, sometimes blood tinged nasal discharged Has been using flonase Helps some for about 30 minutes Using salt spray at home Taking zyrtec daily Taking a decongestant No tylenol/ibuprofen No headaches, just the facial pain Also having R sided tooth pain No fevers Appetite normal Coughing some   Depression screen [facility] 2/9 12/10/2014 01/23/2014 10/26/2013 05/17/2013  Decreased Interest 0 0 0 0  Down, Depressed, Hopeless 0 1 0 1  PHQ - 2 Score 0 1 0 1     Relevant past medical, surgical, family and social history reviewed and updated as indicated. Interim medical history since our last visit reviewed. Allergies and medications reviewed and updated.    ROS: Per HPI unless specifically indicated above  History  Smoking status  . Former Smoker -- 2 years  . Types: Cigarettes  . Quit date: 09/30/2002  Smokeless tobacco  . Never Used    Past Medical History Patient Active Problem List   Diagnosis Date Noted  . Hyperlipidemia with target LDL less than 100 10/26/2013  . Essential hypertension, benign 07/03/2013  . Migraines 10/06/2012    Current Outpatient Prescriptions  Medication Sig Dispense Refill  . fluticasone (FLONASE) 50 MCG/ACT nasal spray Place 1 spray into both nostrils 2 (two) times daily as needed for allergies or rhinitis. 16 g 6  . hydrochlorothiazide (MICROZIDE) 12.5 MG capsule TAKE 1 CAPSULE (12.5 MG TOTAL) BY MOUTH DAILY. 30 capsule 5  . doxycycline (VIBRA-TABS) 100 MG tablet Take 1 tablet (100 mg total) by mouth 2 (two) times daily. 20 tablet 0   No current  facility-administered medications for this visit.       Objective:    BP 130/95 mmHg  Pulse 86  Temp(Src) 97.4 F (36.3 C) (Oral)  Ht  (1.549 m)  Wt 246 lb 12.8 oz (111.948 kg)  BMI 46.66 kg/m2  Wt Readings from Last 3 Encounters:  02/19/15 246 lb 12.8 oz (111.948 kg)  12/10/14 245 lb 9.6 oz (111.403 kg)  04/16/14 245 lb 9.6 oz (111.403 kg)     Gen: NAD, alert, cooperative with exam, NCAT, congested EYES: EOMI, no scleral injection or icterus ENT:  TMs pearly gray b/l, OP without erythema, tender over max sinuses and frontal sinuses b/l LYMPH: no cervical LAD CV: NRRR, normal S1/S2, no murmur, distal pulses 2+ b/l Resp: CTABL, no wheezes, normal WOB Abd: +BS, soft, NTND. no guarding or organomegaly Ext: No edema, warm Neuro: Alert and oriented, strength equal b/l UE and LE, coordination grossly normal MSK: normal muscle bulk     Assessment & Plan:    Dmya was seen today for cough, nasal congestion and facial pain for past two weeks. Will treat with doxycyline for acute sinusitis given pts allergies. Discussed netipot use, symptomatic care.  Diagnoses and all orders for this visit:  Acute sinusitis, recurrence not specified, unspecified location -     doxycycline (VIBRA-TABS) 100 MG tablet; Take 1 tablet (100 mg total) by mouth 2 (two) times daily.    Follow up plan: Return if symptoms worsen or fail to  improve.  Rex Kras, MD [facility] 02/19/2015, 2:17 PM

## 2015-02-19 NOTE — Patient Instructions (Signed)
Netipot with distilled water twice a day to clear out sinuses Ibuprofen and tylenol Lots of fluids 

## 2015-03-07 ENCOUNTER — Telehealth: Payer: Self-pay | Admitting: Nurse Practitioner

## 2015-06-03 ENCOUNTER — Emergency Department (HOSPITAL_BASED_OUTPATIENT_CLINIC_OR_DEPARTMENT_OTHER)
Admission: EM | Admit: 2015-06-03 | Discharge: 2015-06-03 | Disposition: A | Discharge status: Home / Self Care | Payer: Medicaid Other | Attending: Emergency Medicine | Admitting: Emergency Medicine

## 2015-06-03 ENCOUNTER — Encounter (HOSPITAL_BASED_OUTPATIENT_CLINIC_OR_DEPARTMENT_OTHER): Payer: Self-pay

## 2015-06-03 DIAGNOSIS — M79662 Pain in left lower leg: Secondary | ICD-10-CM | POA: Diagnosis not present

## 2015-06-03 DIAGNOSIS — Z87891 Personal history of nicotine dependence: Secondary | ICD-10-CM | POA: Insufficient documentation

## 2015-06-03 DIAGNOSIS — I10 Essential (primary) hypertension: Secondary | ICD-10-CM | POA: Diagnosis not present

## 2015-06-03 MED ORDER — METHOCARBAMOL 500 MG PO TABS: 500.0000 mg | ORAL_TABLET | Freq: Two times a day (BID) | ORAL | Status: DC

## 2015-06-03 MED ORDER — NAPROXEN 500 MG PO TABS: 500.0000 mg | ORAL_TABLET | Freq: Two times a day (BID) | ORAL | Status: DC

## 2015-06-03 NOTE — ED Notes (Signed)
C/o L medial calf pain, just below knee, felt a "pop", onset ~ 30 minutes ago, relates to a pulled muscle or "charlie horse", CMS/ROM intact, compartments soft. No obvious swelling, redness, bruising, discoloration or markings noted. Skin intact. No meds PTA.

## 2015-06-03 NOTE — ED Provider Notes (Signed)
CSN: [id number]     Arrival date & time 06/03/15  0009 History   First MD Initiated Contact with Patient 06/03/15 0017     Chief Complaint  Patient presents with  . Leg Pain     (Consider location/radiation/quality/duration/timing/severity/associated sxs/prior Treatment) HPI   Patient is a 35 year old female past medical history of hypertension who presents the ED with report of left calf pain, onset prior to arrival. Patient reports while she was at home she was walking around her couch, did not see her niece and states she almost stepped on her nieces foot but as she was trying to not bear weight on her right foot she externally rotated her left ankle and put all of her weight on her left leg. She notes during this maneuver she heard a "pop" and began to have sudden onset of sharp cramping pain in her left calf. She notes pain is worse with movement. Denies fall, or any other recent injury. Denies fever, redness, numbness, tingling, weakness, leg swelling, SOB, CP. Patient denies taking any medications prior to arrival.  Past Medical History  Diagnosis Date  . Hypertension    Past Surgical History  Procedure Laterality Date  . Appendectomy  age 44  . Hernia repair Right age 50    RLQ incisional hernia repair with mesh placement  . Laproscopic abd surgery      to remove scar tissue   Family History  Problem Relation Age of Onset  . Cancer Mother     cervical  . Diabetes Father   . Fibroids Sister     uterine   Social History  Substance Use Topics  . Smoking status: Former Smoker -- 2 years    Types: Cigarettes    Quit date: 09/30/2002  . Smokeless tobacco: Never Used  . Alcohol Use: No   OB History    No data available     Review of Systems  Constitutional: Negative for fever.  Musculoskeletal: Positive for myalgias (left calf pain). Negative for joint swelling and arthralgias.  Skin: Negative for wound.  Neurological: Negative for weakness and numbness.       Allergies  Penicillins and Sulfa antibiotics  Home Medications   Prior to Admission medications   Medication Sig Start Date End Date Taking? Authorizing Provider  doxycycline (VIBRA-TABS) 100 MG tablet Take 1 tablet (100 mg total) by mouth 2 (two) times daily. 02/19/15   Johna Sheriff, MD  fluticasone (FLONASE) 50 MCG/ACT nasal spray Place 1 spray into both nostrils 2 (two) times daily as needed for allergies or rhinitis. 12/10/14   Elige Radon Dettinger, MD  hydrochlorothiazide (MICROZIDE) 12.5 MG capsule TAKE 1 CAPSULE (12.5 MG TOTAL) BY MOUTH DAILY. 12/20/14   Elige Radon Dettinger, MD  methocarbamol (ROBAXIN) 500 MG tablet Take 1 tablet (500 mg total) by mouth 2 (two) times daily. 06/03/15   Barrett Henle, PA-C  naproxen (NAPROSYN) 500 MG tablet Take 1 tablet (500 mg total) by mouth 2 (two) times daily. 06/03/15   Satira Sark Kwasi Joung, PA-C   BP 159/90 mmHg  Pulse 93  Temp(Src) 98.2 F (36.8 C) (Oral)  Resp 16  Ht  (1.549 m)  Wt 111.131 kg  BMI 46.32 kg/m2  SpO2 100%  LMP 05/27/2015 Physical Exam  Constitutional: She is oriented to person, place, and time. She appears well-developed and well-nourished.  HENT:  Head: Normocephalic and atraumatic.  Eyes: Conjunctivae and EOM are normal. Right eye exhibits no discharge. Left eye exhibits no discharge. No  scleral icterus.  Neck: Normal range of motion. Neck supple.  Cardiovascular: Normal rate.   Pulmonary/Chest: Effort normal.  Musculoskeletal: Normal range of motion. She exhibits tenderness. She exhibits no edema.       Left ankle: She exhibits normal range of motion, no swelling, no ecchymosis, no deformity, no laceration and normal pulse. No tenderness. Achilles tendon normal. Achilles tendon exhibits no pain, no defect and normal Thompson's test results.       Left lower leg: She exhibits tenderness. She exhibits no bony tenderness, no swelling, no edema, no deformity and no laceration.  TTP over left calf  muscle belly. Full active ROM of left knee, ankle and foot with 5/5 strength. No swelling, abrasion, contusion, erythema or warmth. 2+ DP pulse. Sensation grossly intact. Cap refill <2. Patient able to stand and ambulate but endorses pain in her left calf.  Neurological: She is alert and oriented to person, place, and time.  Nursing note and vitals reviewed.   ED Course  Procedures (including critical care time) Labs Review Labs Reviewed - No data to display  Imaging Review No results found. I have personally reviewed and evaluated these images and lab results as part of my medical decision-making.   EKG Interpretation None      MDM   Final diagnoses:  Pain of left calf    Patient presents with left calf pain that occurred after stepping on her left foot awkwardly PTA while trying to avoid stepping on her niece. Denies any other recent fall, trauma or injury. VSS. Exam revealed tenderness over left gastrocnemius muscle belly, full range of motion of left knee, ankle and foot with 5 out of 5 strength. Left Achilles intact, nml Thompson's exam. Left lower extremity neurovascularly intact. I suspect patient's symptoms are likely due to muscle strain associated with recent reported injury. Pt is able ambulate, no bony abnormality or deformity, no erythema or excessive heat, no evidence of cellulitis, DVT, or septic joint. Plan to discharge patient home with symptomatic treatment and pain meds. Advised patient to follow up with her PCP as needed. Discussed return precautions with patient.    Satira Sarkicole Elizabeth LapointNadeau, [place] JerseyPA-C 06/03/15 0035  Paula LibraJohn Molpus, MD 06/03/15 [id number]  Paula LibraJohn Molpus, MD 06/03/15 [phone]

## 2015-06-03 NOTE — ED Notes (Signed)
Pt reports left leg pain x 30 minutes. Reports hearing a "pop" while walking.

## 2015-06-03 NOTE — ED Notes (Signed)
Steady gait with limp, declined w/c, denies needs or questions

## 2015-06-03 NOTE — Discharge Instructions (Signed)
Take your medications as prescribed for pain relief. I recommend resting, elevating and icing he left leg for 1520 minutes 3-4 times daily for pain relief. Follow up with your primary care provider in 3-4 days if your pain has not improved.  Please return to the Emergency Department if symptoms worsen or new onset of fever, redness, swelling, warmth, numbness, tingling, weakness, shortness of breath, chest pain.

## 2015-06-04 ENCOUNTER — Ambulatory Visit (INDEPENDENT_AMBULATORY_CARE_PROVIDER_SITE_OTHER): Payer: Medicaid Other | Admitting: Family Medicine

## 2015-06-04 ENCOUNTER — Encounter: Payer: Self-pay | Admitting: Family Medicine

## 2015-06-04 VITALS — BP 151/94 | HR 92 | Temp 97.7°F | Ht 61.0 in | Wt 246.9 lb

## 2015-06-04 DIAGNOSIS — I10 Essential (primary) hypertension: Secondary | ICD-10-CM | POA: Diagnosis not present

## 2015-06-04 DIAGNOSIS — S86812A Strain of other muscle(s) and tendon(s) at lower leg level, left leg, initial encounter: Secondary | ICD-10-CM | POA: Diagnosis not present

## 2015-06-04 MED ORDER — LISINOPRIL 20 MG PO TABS: 20.0000 mg | ORAL_TABLET | Freq: Every day | ORAL | Status: DC

## 2015-06-04 NOTE — Assessment & Plan Note (Signed)
Feels like hydrochlorothiazide increased her migraines, will try lisinopril

## 2015-06-04 NOTE — Progress Notes (Signed)
BP 151/94 mmHg  Pulse 92  Temp(Src) 97.7 F (36.5 C) (Oral)  Ht  (1.549 m)  Wt 246 lb 14.4 oz (111.993 kg)  BMI 46.68 kg/m2  LMP 05/27/2015 (Approximate)   Subjective:    Patient ID: Denise Berger, female    DOB: Sep 08, 1980, 35 y.o.   MRN: [id number]  HPI: Denise Berger is a 35 y.o. female presenting on 06/04/2015 for Left calf pain and Hypertension   HPI Hypertension Patient is coming in today for a recheck on hypertension. She has been off the hydrochlorothiazide for at least a couple weeks because she felt like the hydrochlorothiazide was increasing her migraine frequency. Since she has stopped that she's had a couple of small migraines but nothing to the intensity that she was having before. She denies any dizziness or lightheadedness or chest pains or focal numbness or weakness. She would like to try switching to something else for her blood pressure. Her blood pressure today is 151/94.  Calf pain left Patient has left calf pain that has been there for the past 2 days. She went into the emergency department 2 days ago and they diagnosed her with a strain or sprain and give her muscle relaxers and anti-inflammatories. She is starting to feel a little better with those she is also been doing stretching and ice and heat. The pain is a 2 out of 10 while resting but when she tries to move it or ambulate because of the 8 out of 10. She said it happened when she was trying not to step on her little nieces hand and twisted her foot to the side and felt something pop in the back of her calf.  Relevant past medical, surgical, family and social history reviewed and updated as indicated. Interim medical history since our last visit reviewed. Allergies and medications reviewed and updated.  Review of Systems  Constitutional: Negative for fever and chills.  HENT: Negative for congestion, ear discharge and ear pain.   Eyes: Negative for redness and visual disturbance.  Respiratory: Negative for  chest tightness and shortness of breath.   Cardiovascular: Negative for chest pain and leg swelling.  Genitourinary: Negative for dysuria and difficulty urinating.  Musculoskeletal: Positive for myalgias. Negative for back pain, arthralgias and gait problem.  Skin: Negative for color change and rash.  Neurological: Positive for headaches. Negative for dizziness, light-headedness and numbness.  Psychiatric/Behavioral: Negative for behavioral problems and agitation.  All other systems reviewed and are negative.   Per HPI unless specifically indicated above     Medication List       This list is accurate as of: 06/04/15  4:27 PM.  Always use your most recent med list.               lisinopril 20 MG tablet  Commonly known as:  PRINIVIL,ZESTRIL  Take 1 tablet (20 mg total) by mouth daily.     methocarbamol 500 MG tablet  Commonly known as:  ROBAXIN  Take 1 tablet (500 mg total) by mouth 2 (two) times daily.     naproxen 500 MG tablet  Commonly known as:  NAPROSYN  Take 1 tablet (500 mg total) by mouth 2 (two) times daily.           Objective:    BP 151/94 mmHg  Pulse 92  Temp(Src) 97.7 F (36.5 C) (Oral)  Ht  (1.549 m)  Wt 246 lb 14.4 oz (111.993 kg)  BMI 46.68 kg/m2  LMP 05/27/2015 (Approximate)  Wt Readings from Last 3 Encounters:  06/04/15 246 lb 14.4 oz (111.993 kg)  06/03/15 245 lb (111.131 kg)  02/19/15 246 lb 12.8 oz (111.948 kg)    Physical Exam  Constitutional: She is oriented to person, place, and time. She appears well-developed and well-nourished. No distress.  Eyes: Conjunctivae and EOM are normal. Pupils are equal, round, and reactive to light.  Cardiovascular: Normal rate, regular rhythm, normal heart sounds and intact distal pulses.   No murmur heard. Pulmonary/Chest: Effort normal and breath sounds normal. No respiratory distress. She has no wheezes.  Musculoskeletal: Normal range of motion. She exhibits tenderness (Tenderness and pain over  her mid calf on the left side. No pain or tenderness and range of motion of ankle or knee.). She exhibits no edema.  Patient has a limping gait  Neurological: She is alert and oriented to person, place, and time. Coordination normal.  Skin: Skin is warm and dry. No rash noted. She is not diaphoretic.  Psychiatric: She has a normal mood and affect. Her behavior is normal.  Nursing note and vitals reviewed.     Assessment & Plan:   Problem List Items Addressed This Visit      Cardiovascular and Mediastinum   Essential hypertension, benign - Primary    Feels like hydrochlorothiazide increased her migraines, will try lisinopril      Relevant Medications   lisinopril (PRINIVIL,ZESTRIL) 20 MG tablet    Other Visit Diagnoses    Strain of calf muscle, left, initial encounter        Use anti-inflammatories and muscle relaxer and call if not improving in 2 weeks        Follow up plan: Return in about 4 weeks (around 07/02/2015), or if symptoms worsen or fail to improve, for Hypertension recheck.  Counseling provided for all of the vaccine components No orders of the defined types were placed in this encounter.    Arville CareJoshua Elle Vezina, MD [facility] 06/04/2015, 4:27 PM

## 2015-06-07 ENCOUNTER — Telehealth: Payer: Self-pay | Admitting: Family Medicine

## 2015-06-07 MED ORDER — LOSARTAN POTASSIUM 50 MG PO TABS: 50.0000 mg | ORAL_TABLET | Freq: Every day | ORAL | Status: DC

## 2015-06-07 NOTE — Telephone Encounter (Signed)
Losartan Prescription sent to pharmacy, pt to stop lisinopril and keep follow up appt

## 2015-06-07 NOTE — Telephone Encounter (Signed)
Pt is aware.  

## 2015-06-07 NOTE — Telephone Encounter (Signed)
Patient saw Dr. Louanne Skyeettinger on 5/30 and was prescribed Lisinopril, patient reports medication is causing headaches.  Please advise

## 2015-07-02 ENCOUNTER — Ambulatory Visit: Payer: Medicaid Other | Admitting: Family Medicine

## 2015-07-03 ENCOUNTER — Encounter: Payer: Self-pay | Admitting: Nurse Practitioner

## 2015-09-22 ENCOUNTER — Other Ambulatory Visit: Payer: Self-pay | Admitting: Family

## 2016-01-26 ENCOUNTER — Other Ambulatory Visit: Payer: Self-pay | Admitting: Nurse Practitioner

## 2016-03-07 ENCOUNTER — Encounter: Payer: Self-pay | Admitting: Pediatrics

## 2016-03-07 ENCOUNTER — Ambulatory Visit (INDEPENDENT_AMBULATORY_CARE_PROVIDER_SITE_OTHER): Payer: Medicaid Other | Admitting: Pediatrics

## 2016-03-07 VITALS — BP 135/84 | HR 62 | Temp 97.5°F | Ht 61.0 in | Wt 226.0 lb

## 2016-03-07 DIAGNOSIS — J069 Acute upper respiratory infection, unspecified: Secondary | ICD-10-CM

## 2016-03-07 DIAGNOSIS — B9789 Other viral agents as the cause of diseases classified elsewhere: Secondary | ICD-10-CM | POA: Diagnosis not present

## 2016-03-07 MED ORDER — FLUTICASONE PROPIONATE 50 MCG/ACT NA SUSP: 2.0000 | Freq: Every day | NASAL | 6 refills | Status: DC

## 2016-03-07 NOTE — Patient Instructions (Signed)
Netipot with distilled water 2-3 times a day to clear out sinuses Or Normal saline nasal spray Flonase steroid nasal spray Antihistamine daily such as cetirizine Ibuprofen 600mg three times a day Lots of fluids  

## 2016-03-07 NOTE — Progress Notes (Signed)
  Subjective:   Patient ID: Denise Berger, female    DOB: [birth year], 36 y.o.   MRN: [id number] CC: Sinusitis (OTC alcoselsor, claritin, excedrin, nasal spray); Headache (no known fever started Wednesday); and Cough (last 2 days)  HPI: Denise Berger is a 36 y.o. female presenting for Sinusitis (OTC alcoselsor, claritin, excedrin, nasal spray); Headache (no known fever started Wednesday); and Cough (last 2 days)  Ongoing for about 6 days Working yesterday so voice is more hoarse Has been taking oxymetazoline/Afrin nose spray 3 times a day for the last week No fevers Appetite ok Congestion bothering her the most, feels pressure in ears  Relevant past medical, surgical, family and social history reviewed. Allergies and medications reviewed and updated. History  Smoking Status  . Former Smoker  . Years: 2.00  . Types: Cigarettes  . Quit date: 09/30/2002  Smokeless Tobacco  . Never Used   ROS: Per HPI   Objective:    BP 135/84   Pulse 62   Temp 97.5 F (36.4 C) (Oral)   Ht 5\' 1"  (1.549 m)   Wt 226 lb (102.5 kg)   BMI 42.70 kg/m   Wt Readings from Last 3 Encounters:  03/07/16 226 lb (102.5 kg)  06/04/15 246 lb 14.4 oz (112 kg)  06/03/15 245 lb (111.1 kg)    Gen: NAD, alert, cooperative with exam, NCAT EYES: EOMI, no conjunctival injection, or no icterus ENT:  TMs pearly gray b/l, OP with mild erythema, no tenderness over sinuses LYMPH: no cervical LAD CV: NRRR, normal S1/S2, no murmur Resp: CTABL, no wheezes, normal WOB Neuro: Alert and oriented MSK: normal muscle bulk  Assessment & Plan:  [name] was seen today for sinusitis, headache and cough.  Diagnoses and all orders for this visit:  Viral upper respiratory illness Stop afrin spray Dont use longer than 24-48h at a time in the future Discussed symptom care Start below neti pot prn -     fluticasone (FLONASE) 50 MCG/ACT nasal spray; Place 2 sprays into both nostrils daily.   Follow up plan: Return if symptoms worsen  or fail to improve. Rex Krasarol Aldyn Toon, MD [facility]

## 2018-05-10 ENCOUNTER — Encounter: Payer: Self-pay | Admitting: Nurse Practitioner

## 2018-05-10 ENCOUNTER — Other Ambulatory Visit: Payer: Self-pay

## 2018-05-10 ENCOUNTER — Ambulatory Visit (INDEPENDENT_AMBULATORY_CARE_PROVIDER_SITE_OTHER): Payer: Medicaid Other | Admitting: Nurse Practitioner

## 2018-05-10 DIAGNOSIS — L247 Irritant contact dermatitis due to plants, except food: Secondary | ICD-10-CM | POA: Diagnosis not present

## 2018-05-10 MED ORDER — PREDNISONE 10 MG (21) PO TBPK: ORAL_TABLET | ORAL | 0 refills | Status: DC

## 2018-05-10 NOTE — Progress Notes (Signed)
   Virtual Visit via telephone Note  I connected with Denise Berger on 05/10/18 at 4:10 PM by video and verified that I am speaking with the correct person using two identifiers. [place] and son  is currently with her during visit. The provider, [name], FNP is located in their office at time of visit.  I discussed the limitations, risks, security and privacy concerns of performing an evaluation and management service by telephone and the availability of in person appointments. I also discussed with the patient that there may be a patient responsible charge related to this service. The patient expressed understanding and agreed to proceed.   History and Present Illness:  Denise Berger in today with chief complaint of Dermatitis   Patient does video visit to day to show n=me her rash. She has been pulling weeds in her yard and has a rash under her chin , both forearms and lower legs. She has been using calamine lotion and benadryl which helps with the itching.     Review of Systems  Constitutional: Negative.   Respiratory: Negative.   Cardiovascular: Negative.   Genitourinary: Negative.   Skin: Positive for rash.  Neurological: Negative.   Psychiatric/Behavioral: Negative.   All other systems reviewed and are negative.      Observations/Objective: Alert and oriented Vesicular lesions on under surface of chin, bil forearms and bil ankle area. She has calamine lotion on them  Assessment and Plan: Denise Berger in today with chief complaint of Dermatitis   1. Irritant contact dermatitis due to plants, except food Continue calamine lotion and benadryl Cool compresses Avoid scratching Meds ordered this encounter  Medications  . predniSONE (STERAPRED UNI-PAK 21 TAB) 10 MG (21) TBPK tablet    Sig: As directed x 6 days    Dispense:  21 tablet    Refill:  0    Order Specific Question:   Supervising Provider    Answer:   Arville Care A  [[id number]]      Follow Up Instructions: prn    I discussed the assessment and treatment plan with the patient. The patient was provided an opportunity to ask questions and all were answered. The patient agreed with the plan and demonstrated an understanding of the instructions.   The patient was advised to call back or seek an in-person evaluation if the symptoms worsen or if the condition fails to improve as anticipated.  The above assessment and management plan was discussed with the patient. The patient verbalized understanding of and has agreed to the management plan. Patient is aware to call the clinic if symptoms persist or worsen. Patient is aware when to return to the clinic for a follow-up visit. Patient educated on when it is appropriate to go to the emergency department.   Time call ended: 4:23 I provided 23 minutes of face-to-face time during this encounter.    [name], FNP

## 2018-11-25 ENCOUNTER — Other Ambulatory Visit: Payer: Self-pay

## 2018-11-25 ENCOUNTER — Ambulatory Visit (INDEPENDENT_AMBULATORY_CARE_PROVIDER_SITE_OTHER): Payer: Medicaid Other | Admitting: Family Medicine

## 2018-11-25 ENCOUNTER — Encounter: Payer: Self-pay | Admitting: Family Medicine

## 2018-11-25 DIAGNOSIS — J019 Acute sinusitis, unspecified: Secondary | ICD-10-CM

## 2018-11-25 DIAGNOSIS — B9689 Other specified bacterial agents as the cause of diseases classified elsewhere: Secondary | ICD-10-CM | POA: Diagnosis not present

## 2018-11-25 MED ORDER — FLUTICASONE PROPIONATE 50 MCG/ACT NA SUSP: 2.0000 | Freq: Every day | NASAL | 6 refills | Status: DC

## 2018-11-25 MED ORDER — DOXYCYCLINE HYCLATE 100 MG PO TABS: 100.0000 mg | ORAL_TABLET | Freq: Two times a day (BID) | ORAL | 0 refills | Status: AC

## 2018-11-25 MED ORDER — DOXYCYCLINE HYCLATE 100 MG PO TABS: 100.0000 mg | ORAL_TABLET | Freq: Two times a day (BID) | ORAL | 0 refills | Status: DC

## 2018-11-25 MED ORDER — PREDNISONE 10 MG (21) PO TBPK: ORAL_TABLET | ORAL | 0 refills | Status: DC

## 2018-11-25 NOTE — Progress Notes (Signed)
Virtual Visit via telephone Note Due to COVID-19 pandemic this visit was conducted virtually. This visit type was conducted due to national recommendations for restrictions regarding the COVID-19 Pandemic (e.g. social distancing, sheltering in place) in an effort to limit this patient's exposure and mitigate transmission in our community. All issues noted in this document were discussed and addressed.  A physical exam was not performed with this format.   I connected with Denise Berger on 11/25/2018 at 1100 by telephone and verified that I am speaking with the correct person using two identifiers. Denise Berger is currently located at home and family is currently with them during visit. The provider, Monia Pouch, FNP is located in their office at time of visit.  I discussed the limitations, risks, security and privacy concerns of performing an evaluation and management service by telephone and the availability of in person appointments. I also discussed with the patient that there may be a patient responsible charge related to this service. The patient expressed understanding and agreed to proceed.  Subjective:  Patient ID: Denise Berger, female    DOB: July 07, 1980, 38 y.o.   MRN: [id number]  Chief Complaint:  Sinus Problem   HPI: Denise Berger is a 38 y.o. female presenting on 11/25/2018 for Sinus Problem   Pt reports 12 days of sinus pressure, pressure in ears, low grade fever, postnasal drainage, headache and congestion. She has been using Flonase and Xyzal daily along with Mucinex without relief of symptoms.   Sinus Problem This is a new problem. The current episode started 1 to 4 weeks ago. The problem has been gradually worsening since onset. The maximum temperature recorded prior to her arrival was 100.4 - 100.9 F. Her pain is at a severity of 5/10. The pain is moderate. Associated symptoms include chills, congestion, ear pain, headaches, a hoarse voice, sinus pressure, a sore throat and swollen  glands. Pertinent negatives include no coughing, diaphoresis, neck pain, shortness of breath or sneezing. Past treatments include spray decongestants and oral decongestants. The treatment provided no relief.     Relevant past medical, surgical, family, and social history reviewed and updated as indicated.  Allergies and medications reviewed and updated.   Past Medical History:  Diagnosis Date  . Hypertension     Past Surgical History:  Procedure Laterality Date  . APPENDECTOMY  age 64  . HERNIA REPAIR Right age 32   RLQ incisional hernia repair with mesh placement  . laproscopic abd surgery     to remove scar tissue    Social History   Socioeconomic History  . Marital status: Single    Spouse name: Not on file  . Number of children: Not on file  . Years of education: Not on file  . Highest education level: Not on file  Occupational History  . Not on file  Social Needs  . Financial resource strain: Not on file  . Food insecurity    Worry: Not on file    Inability: Not on file  . Transportation needs    Medical: Not on file    Non-medical: Not on file  Tobacco Use  . Smoking status: Former Smoker    Years: 2.00    Types: Cigarettes    Quit date: 09/30/2002    Years since quitting: 16.1  . Smokeless tobacco: Never Used  Substance and Sexual Activity  . Alcohol use: No  . Drug use: No  . Sexual activity: Not on file  Lifestyle  . Physical activity  Days per week: Not on file    Minutes per session: Not on file  . Stress: Not on file  Relationships  . Social Musicianconnections    Talks on phone: Not on file    Gets together: Not on file    Attends religious service: Not on file    Active member of club or organization: Not on file    Attends meetings of clubs or organizations: Not on file    Relationship status: Not on file  . Intimate partner violence    Fear of current or ex partner: Not on file    Emotionally abused: Not on file    Physically abused: Not on  file    Forced sexual activity: Not on file  Other Topics Concern  . Not on file  Social History Narrative  . Not on file    Outpatient Encounter Medications as of 11/25/2018  Medication Sig  . doxycycline (VIBRA-TABS) 100 MG tablet Take 1 tablet (100 mg total) by mouth 2 (two) times daily for 7 days. 1 po bid  . fluticasone (FLONASE) 50 MCG/ACT nasal spray Place 2 sprays into both nostrils daily.  . predniSONE (STERAPRED UNI-PAK 21 TAB) 10 MG (21) TBPK tablet As directed x 6 days  . [DISCONTINUED] doxycycline (VIBRA-TABS) 100 MG tablet Take 1 tablet (100 mg total) by mouth 2 (two) times daily for 7 days. 1 po bid  . [DISCONTINUED] fluticasone (FLONASE) 50 MCG/ACT nasal spray Place 2 sprays into both nostrils daily.  . [DISCONTINUED] fluticasone (FLONASE) 50 MCG/ACT nasal spray Place 2 sprays into both nostrils daily.  . [DISCONTINUED] predniSONE (STERAPRED UNI-PAK 21 TAB) 10 MG (21) TBPK tablet As directed x 6 days  . [DISCONTINUED] predniSONE (STERAPRED UNI-PAK 21 TAB) 10 MG (21) TBPK tablet As directed x 6 days   No facility-administered encounter medications on file as of 11/25/2018.     Allergies  Allergen Reactions  . Penicillins Hives  . Sulfa Antibiotics Hives    Review of Systems  Constitutional: Positive for chills and fatigue. Negative for activity change, appetite change, diaphoresis, fever and unexpected weight change.  HENT: Positive for congestion, ear pain, hoarse voice, postnasal drip, rhinorrhea, sinus pressure, sinus pain, sore throat and voice change. Negative for sneezing, tinnitus and trouble swallowing.   Eyes: Negative.   Respiratory: Negative for cough, chest tightness, shortness of breath and wheezing.   Cardiovascular: Negative for chest pain, palpitations and leg swelling.  Gastrointestinal: Negative for abdominal pain, blood in stool, constipation, diarrhea, nausea and vomiting.  Endocrine: Negative.   Genitourinary: Negative for decreased urine  volume, difficulty urinating, dysuria, frequency and urgency.  Musculoskeletal: Negative for arthralgias, myalgias and neck pain.  Skin: Negative.  Negative for color change, pallor and rash.  Allergic/Immunologic: Negative.   Neurological: Positive for headaches. Negative for dizziness, tremors, seizures, syncope, speech difficulty, weakness, light-headedness and numbness.  Hematological: Negative.   Psychiatric/Behavioral: Negative for confusion, hallucinations, sleep disturbance and suicidal ideas.  All other systems reviewed and are negative.        Observations/Objective: No vital signs or physical exam, this was a telephone or virtual health encounter.  Pt alert and oriented, answers all questions appropriately, and able to speak in full sentences.    Assessment and Plan: [name] was seen today for sinus problem.  Diagnoses and all orders for this visit:  Acute bacterial sinusitis Reported symptoms consistent with acute sinusitis. Pt has been symptomatic for 12 days with low grade fever. No relief with over the counter  therapy with worsening symptoms. Will initiate below. Symptomatic care discussed in detail. Increase water intake. Report any new, worsening, or persistent symptoms.  -     doxycycline (VIBRA-TABS) 100 MG tablet; Take 1 tablet (100 mg total) by mouth 2 (two) times daily for 7 days. 1 po bid -     fluticasone (FLONASE) 50 MCG/ACT nasal spray; Place 2 sprays into both nostrils daily. -     predniSONE (STERAPRED UNI-PAK 21 TAB) 10 MG (21) TBPK tablet; As directed x 6 days     Follow Up Instructions: Return if symptoms worsen or fail to improve.    I discussed the assessment and treatment plan with the patient. The patient was provided an opportunity to ask questions and all were answered. The patient agreed with the plan and demonstrated an understanding of the instructions.   The patient was advised to call back or seek an in-person evaluation if the symptoms  worsen or if the condition fails to improve as anticipated.  The above assessment and management plan was discussed with the patient. The patient verbalized understanding of and has agreed to the management plan. Patient is aware to call the clinic if they develop any new symptoms or if symptoms persist or worsen. Patient is aware when to return to the clinic for a follow-up visit. Patient educated on when it is appropriate to go to the emergency department.    I provided 15 minutes of non-face-to-face time during this encounter. The call started at 1100. The call ended at 1115. The other time was used for coordination of care.    Kari Baars, FNP-C [facility] [address] [place], [place] [address] [phone] 11/25/2018

## 2018-12-31 ENCOUNTER — Other Ambulatory Visit: Payer: Self-pay

## 2018-12-31 ENCOUNTER — Emergency Department (INDEPENDENT_AMBULATORY_CARE_PROVIDER_SITE_OTHER): Payer: Medicaid Other

## 2018-12-31 ENCOUNTER — Encounter: Payer: Self-pay | Admitting: Emergency Medicine

## 2018-12-31 ENCOUNTER — Emergency Department (INDEPENDENT_AMBULATORY_CARE_PROVIDER_SITE_OTHER)
Admission: EM | Admit: 2018-12-31 | Discharge: 2018-12-31 | Disposition: A | Discharge status: Home / Self Care | Payer: Medicaid Other | Source: Home / Self Care | Attending: Emergency Medicine | Admitting: Emergency Medicine

## 2018-12-31 DIAGNOSIS — S40011A Contusion of right shoulder, initial encounter: Secondary | ICD-10-CM

## 2018-12-31 DIAGNOSIS — W19XXXA Unspecified fall, initial encounter: Secondary | ICD-10-CM

## 2018-12-31 DIAGNOSIS — R079 Chest pain, unspecified: Secondary | ICD-10-CM | POA: Diagnosis not present

## 2018-12-31 DIAGNOSIS — M25511 Pain in right shoulder: Secondary | ICD-10-CM | POA: Diagnosis not present

## 2018-12-31 DIAGNOSIS — S20211A Contusion of right front wall of thorax, initial encounter: Secondary | ICD-10-CM | POA: Diagnosis not present

## 2018-12-31 DIAGNOSIS — R0789 Other chest pain: Secondary | ICD-10-CM

## 2018-12-31 MED ORDER — HYDROCODONE-ACETAMINOPHEN 5-325 MG PO TABS: ORAL_TABLET | ORAL | 0 refills | Status: DC

## 2018-12-31 NOTE — ED Triage Notes (Signed)
Pt fell on christmas eve on her hardwood floors. Landed on her right arm. Pain that radiates down into her hand.

## 2018-12-31 NOTE — Discharge Instructions (Addendum)
You can take ibuprofen 2 every 6 hours for pain. You have a sling for comfort. Please follow-up with your regular doctor next week. You have hydrocodone to take for severe pain.

## 2018-12-31 NOTE — ED Provider Notes (Signed)
[facility]    CSN: [id number] Arrival date & time: 12/31/18  1129      History   Chief Complaint Chief Complaint  Patient presents with  . Arm Pain    HPI Denise Berger is a 38 y.o. female.   HPI Patient states she was in good health until Christmas Eve a day and a half ago when she was picking up her dog and had difficulty and she fell to the ground striking her right shoulder and right anterior chest on the ground.  Since that time she has had significant pain with any movement of the right shoulder.  She has some tenderness over the anterior chest wall.  She has had no numbness or tingling into her hand or forearm.  Last menstrual period was 1 week ago and normal. Past Medical History:  Diagnosis Date  . Hypertension     Patient Active Problem List   Diagnosis Date Noted  . Hyperlipidemia with target LDL less than 100 10/26/2013  . Essential hypertension, benign 07/03/2013  . Migraines 10/06/2012    Past Surgical History:  Procedure Laterality Date  . APPENDECTOMY  age 53  . HERNIA REPAIR Right age 5   RLQ incisional hernia repair with mesh placement  . laproscopic abd surgery     to remove scar tissue    OB History   No obstetric history on file.      Home Medications    Prior to Admission medications   Medication Sig Start Date End Date Taking? Authorizing Provider  fluticasone (FLONASE) 50 MCG/ACT nasal spray Place 2 sprays into both nostrils daily. 11/25/18   Baruch Gouty, FNP  HYDROcodone-acetaminophen (NORCO/VICODIN) 5-325 MG tablet 1/2 to 1 tablet every 6 hours for severe pain 12/31/18   Darlyne Russian, MD  predniSONE (STERAPRED UNI-PAK 21 TAB) 10 MG (21) TBPK tablet As directed x 6 days 11/25/18   Baruch Gouty, FNP    Family History Family History  Problem Relation Age of Onset  . Cancer Mother        cervical  . Diabetes Father   . Fibroids Sister        uterine    Social History Social History   Tobacco Use  . Smoking  status: Former Smoker    Years: 2.00    Types: Cigarettes    Quit date: 09/30/2002    Years since quitting: 16.2  . Smokeless tobacco: Never Used  Substance Use Topics  . Alcohol use: No  . Drug use: No     Allergies   Penicillins and Sulfa antibiotics   Review of Systems Review of Systems  HENT: Negative.   Respiratory: Negative.   Cardiovascular: Positive for chest pain.  Gastrointestinal: Negative.   Musculoskeletal: Negative for back pain.       Patient has pain in her right shoulder with difficulty moving her right shoulder.  She also has pain in her right anterior chest wall.     Physical Exam Triage Vital Signs ED Triage Vitals  Enc Vitals Group     BP 12/31/18 1237 135/81     Pulse Rate 12/31/18 1237 83     Resp --      Temp 12/31/18 1237 98 F (36.7 C)     Temp Source 12/31/18 1237 Oral     SpO2 12/31/18 1237 98 %     Weight 12/31/18 1237 235 lb (106.6 kg)     Height --      Head  Circumference --      Peak Flow --      Pain Score 12/31/18 1236 8     Pain Loc --      Pain Edu? --      Excl. in GC? --    No data found.  Updated Vital Signs BP 135/81 (BP Location: Right Arm)   Pulse 83   Temp 98 F (36.7 C) (Oral)   Wt 106.6 kg   LMP 12/23/2018 (Approximate)   SpO2 98%   BMI 44.40 kg/m   Visual Acuity Right Eye Distance:   Left Eye Distance:   Bilateral Distance:    Right Eye Near:   Left Eye Near:    Bilateral Near:     Physical Exam Constitutional:      Comments: Patient sitting in the room holding her right arm.  She appears to be in pain.  HENT:     Head: Normocephalic.  Cardiovascular:     Rate and Rhythm: Normal rate.  Pulmonary:     Effort: Pulmonary effort is normal.     Breath sounds: Normal breath sounds.  Musculoskeletal:     Comments: There is tenderness present over the anterior shoulder and over the entire clavicle.  There is severe disc discomfort with internal and external rotation of the right shoulder.  There is no  venous distention over the back of the right hand.  Radial pulse is 3+.      UC Treatments / Results  Labs (all labs ordered are listed, but only abnormal results are displayed) Labs Reviewed - No data to display  EKG   Radiology DG Chest 2 View  Result Date: 12/31/2018 CLINICAL DATA:  Right shoulder and upper chest pain since falling 2 days ago. History of right clavicle fracture. EXAM: CHEST - 2 VIEW COMPARISON:  None. FINDINGS: The heart size and mediastinal contours are normal. The lungs are clear. There is no pleural effusion or pneumothorax. No acute osseous findings are identified. The right clavicle appears unremarkable. IMPRESSION: No active cardiopulmonary process. Electronically Signed   By: Carey Bullocks M.D.   On: 12/31/2018 13:12   DG Shoulder Right  Result Date: 12/31/2018 CLINICAL DATA:  Right shoulder pain since falling 2 days ago. History of right clavicle fracture. EXAM: RIGHT SHOULDER - 2+ VIEW COMPARISON:  None. FINDINGS: The mineralization and alignment are normal. There is no evidence of acute fracture or dislocation. The joint spaces and subacromial space are preserved. There is questionable faint calcification within the soft tissues adjacent to the greater tuberosity which could reflect calcific tendinitis/bursitis. IMPRESSION: No acute osseous findings. Possible calcific tendinitis/bursitis adjacent to the greater tuberosity. Electronically Signed   By: Carey Bullocks M.D.   On: 12/31/2018 13:11    Procedures Procedures (including critical care time)  Medications Ordered in UC Medications - No data to display  Initial Impression / Assessment and Plan / UC Course  I have reviewed the triage vital signs and the nursing notes. No fracture was seen.  She will be treated with ice and ibuprofen with hydrocodone for breakthrough pain.  There was some calcification adjacent to the greater tuberosity consistent with bursitis but otherwise no fractures or  abnormalities were seen she will follow-up with her PCP. Pertinent labs & imaging results that were available during my care of the patient were reviewed by me and considered in my medical decision making (see chart for details).      Final Clinical Impressions(s) / UC Diagnoses   Final diagnoses:  Contusion of right shoulder, initial encounter  Contusion, chest wall, right, initial encounter     Discharge Instructions     You can take ibuprofen 2 every 6 hours for pain.     ED Prescriptions    Medication Sig Dispense Auth. Provider   HYDROcodone-acetaminophen (NORCO/VICODIN) 5-325 MG tablet 1/2 to 1 tablet every 6 hours for severe pain 10 tablet Nahla Lukin, Maylon PeppersSteven A, MD     I have reviewed the PDMP during this encounter.   Collene Gobbleaub, Rashaun Curl A, MD 12/31/18 1318

## 2019-01-10 ENCOUNTER — Ambulatory Visit (INDEPENDENT_AMBULATORY_CARE_PROVIDER_SITE_OTHER): Payer: Medicaid Other | Admitting: Nurse Practitioner

## 2019-01-10 ENCOUNTER — Encounter: Payer: Self-pay | Admitting: Nurse Practitioner

## 2019-01-10 DIAGNOSIS — J329 Chronic sinusitis, unspecified: Secondary | ICD-10-CM | POA: Diagnosis not present

## 2019-01-10 MED ORDER — DOXYCYCLINE HYCLATE 100 MG PO TABS: 100.0000 mg | ORAL_TABLET | Freq: Two times a day (BID) | ORAL | 0 refills | Status: DC

## 2019-01-10 MED ORDER — CHLORPHEN-PE-ACETAMINOPHEN 4-10-325 MG PO TABS: 1.0000 | ORAL_TABLET | Freq: Four times a day (QID) | ORAL | 0 refills | Status: DC | PRN

## 2019-01-10 NOTE — Progress Notes (Signed)
Virtual Visit via telephone Note Due to COVID-19 pandemic this visit was conducted virtually. This visit type was conducted due to national recommendations for restrictions regarding the COVID-19 Pandemic (e.g. social distancing, sheltering in place) in an effort to limit this patient's exposure and mitigate transmission in our community. All issues noted in this document were discussed and addressed.  A physical exam was not performed with this format.  I connected with Bonniejean Piano on 01/10/19 at 425 by telephone and verified that I am speaking with the correct person using two identifiers. Denise Berger is currently located at home and her children is currently with her during visit. The provider, Mary-Margaret Hassell Done, FNP is located in their office at time of visit.  I discussed the limitations, risks, security and privacy concerns of performing an evaluation and management service by telephone and the availability of in person appointments. I also discussed with the patient that there may be a patient responsible charge related to this service. The patient expressed understanding and agreed to proceed.   History and Present Illness:   Chief Complaint: Sinusitis   HPs Patient calls in c/o sinus pressure and congestion. She had wisdom teeth pulled the first of November and has been bothering her every since. She saw oral surgeon for follow up and they did xray no infection seen in in areas of surgery. She did televisit on 11/25/18 and was given steroid, nasal spray and zpak and she never really goit better. She says she feels worse today then she did yesterday.   Review of Systems  Constitutional: Negative for chills and fever.  HENT: Positive for congestion and sinus pain.   Respiratory: Negative for cough and shortness of breath.   Neurological: Positive for headaches. Negative for dizziness.  Psychiatric/Behavioral: Negative.   All other systems reviewed and are  negative.    Observations/Objective: Alert and oriented- answers all questions appropriately No distress    Assessment and Plan: Denise Berger in today with chief complaint of Sinusitis   1. Recurrent sinusitis 1. Take meds as prescribed 2. Use a cool mist humidifier especially during the winter months and when heat has been humid. 3. Use saline nose sprays frequently 4. Saline irrigations of the nose can be very helpful if done frequently.  * 4X daily for 1 week*  * Use of a nettie pot can be helpful with this. Follow directions with this* 5. Drink plenty of fluids 6. Keep thermostat turn down low 7.For any cough or congestion  Use plain Mucinex- regular strength or max strength is fine   * Children- consult with Pharmacist for dosing 8. For fever or aces or pains- take tylenol or ibuprofen appropriate for age and weight.  * for fevers greater than 101 orally you may alternate ibuprofen and tylenol every  3 hours.    - doxycycline (VIBRA-TABS) 100 MG tablet; Take 1 tablet (100 mg total) by mouth 2 (two) times daily. 1 po bid  Dispense: 14 tablet; Refill: 0 - Chlorphen-PE-Acetaminophen 4-10-325 MG TABS; Take 1 tablet by mouth every 6 (six) hours as needed.  Dispense: 20 tablet; Refill: 0   Follow Up Instructions: prn    I discussed the assessment and treatment plan with the patient. The patient was provided an opportunity to ask questions and all were answered. The patient agreed with the plan and demonstrated an understanding of the instructions.   The patient was advised to call back or seek an in-person evaluation if the symptoms worsen or if the  condition fails to improve as anticipated.  The above assessment and management plan was discussed with the patient. The patient verbalized understanding of and has agreed to the management plan. Patient is aware to call the clinic if symptoms persist or worsen. Patient is aware when to return to the clinic for a follow-up visit.  Patient educated on when it is appropriate to go to the emergency department.   Time call ended:  4:32  I provided 12 minutes of non-face-to-face time during this encounter.    [name], FNP

## 2019-03-02 ENCOUNTER — Encounter: Payer: Self-pay | Admitting: Nurse Practitioner

## 2019-03-02 ENCOUNTER — Ambulatory Visit (INDEPENDENT_AMBULATORY_CARE_PROVIDER_SITE_OTHER): Payer: Medicaid Other | Admitting: Nurse Practitioner

## 2019-03-02 DIAGNOSIS — J0101 Acute recurrent maxillary sinusitis: Secondary | ICD-10-CM

## 2019-03-02 NOTE — Progress Notes (Signed)
Virtual Visit via telephone Note Due to COVID-19 pandemic this visit was conducted virtually. This visit type was conducted due to national recommendations for restrictions regarding the COVID-19 Pandemic (e.g. social distancing, sheltering in place) in an effort to limit this patient's exposure and mitigate transmission in our community. All issues noted in this document were discussed and addressed.  A physical exam was not performed with this format.  I connected with Denise Berger on 03/02/19 at 1010 by telephone and verified that I am speaking with the correct person using two identifiers. Denise Berger is currently located at home and no one is currently with her during visit. The provider, Mary-Margaret Hassell Done, FNP is located in their office at time of visit.  I discussed the limitations, risks, security and privacy concerns of performing an evaluation and management service by telephone and the availability of in person appointments. I also discussed with the patient that there may be a patient responsible charge related to this service. The patient expressed understanding and agreed to proceed.   History and Present Illness:   Chief Complaint: Sinusitis    HPI: Patient calls in today for appointment stating that she had her wisdom teeth cut out 6 weeks ago. since then she has had a bad "snot" taste in the back of her throat/ she had follow up with her oral surgeon earlier this week and he did an xray and her and said that her right sinus is blocked. He gave her doxycycline and said that she needs to see ENT specialist, but he could not do referral. She wanted to know if I could place referral for her.    Outpatient Encounter Medications as of 03/02/2019  Medication Sig  . Chlorphen-PE-Acetaminophen 4-10-325 MG TABS Take 1 tablet by mouth every 6 (six) hours as needed.  . doxycycline (VIBRA-TABS) 100 MG tablet Take 1 tablet (100 mg total) by mouth 2 (two) times daily. 1 po bid  .  fluticasone (FLONASE) 50 MCG/ACT nasal spray Place 2 sprays into both nostrils daily.  Marland Kitchen HYDROcodone-acetaminophen (NORCO/VICODIN) 5-325 MG tablet 1/2 to 1 tablet every 6 hours for severe pain  . predniSONE (STERAPRED UNI-PAK 21 TAB) 10 MG (21) TBPK tablet As directed x 6 days     Past Surgical History:  Procedure Laterality Date  . APPENDECTOMY  age 7  . HERNIA REPAIR Right age 65   RLQ incisional hernia repair with mesh placement  . laproscopic abd surgery     to remove scar tissue    Family History  Problem Relation Age of Onset  . Cancer Mother        cervical  . Diabetes Father   . Fibroids Sister        uterine    New complaints: None today  Social history: Lives with husband and kids  Controlled substance contract: n/a     Review of Systems  Constitutional: Negative for diaphoresis and weight loss.  HENT: Positive for congestion (righht side of face).   Eyes: Negative for blurred vision, double vision and pain.  Respiratory: Negative for shortness of breath.   Cardiovascular: Negative for chest pain, palpitations, orthopnea and leg swelling.  Gastrointestinal: Negative for abdominal pain.  Skin: Negative for rash.  Neurological: Negative for dizziness, sensory change, loss of consciousness, weakness and headaches.  Endo/Heme/Allergies: Negative for polydipsia. Does not bruise/bleed easily.  Psychiatric/Behavioral: Negative for memory loss. The patient does not have insomnia.   All other systems reviewed and are negative.    Observations/Objective:  Alert and oriented- answers all questions appropriately No distress Voice is very nasal  Assessment and Plan: Norell Brisbin in today with chief complaint of Sinusitis   1. Acute recurrent maxillary sinusitis Finish antibiotic  Force fluids flonase nasl spray OTC to see if helps OTC decongestant may also help - Ambulatory referral to ENT    The above assessment and management plan was discussed with the  patient. The patient verbalized understanding of and has agreed to the management plan. Patient is aware to call the clinic if symptoms persist or worsen. Patient is aware when to return to the clinic for a follow-up visit. Patient educated on when it is appropriate to go to the emergency department.   [name], FNP    Follow Up Instructions: prn    I discussed the assessment and treatment plan with the patient. The patient was provided an opportunity to ask questions and all were answered. The patient agreed with the plan and demonstrated an understanding of the instructions.   The patient was advised to call back or seek an in-person evaluation if the symptoms worsen or if the condition fails to improve as anticipated.  The above assessment and management plan was discussed with the patient. The patient verbalized understanding of and has agreed to the management plan. Patient is aware to call the clinic if symptoms persist or worsen. Patient is aware when to return to the clinic for a follow-up visit. Patient educated on when it is appropriate to go to the emergency department.   Time call ended:  10:20  I provided 10 minutes of non-face-to-face time during this encounter.    [name], FNP

## 2019-03-21 DIAGNOSIS — J310 Chronic rhinitis: Secondary | ICD-10-CM | POA: Diagnosis not present

## 2019-03-21 DIAGNOSIS — J342 Deviated nasal septum: Secondary | ICD-10-CM | POA: Diagnosis not present

## 2019-03-21 DIAGNOSIS — J343 Hypertrophy of nasal turbinates: Secondary | ICD-10-CM | POA: Diagnosis not present

## 2019-03-23 ENCOUNTER — Other Ambulatory Visit: Payer: Self-pay | Admitting: Otolaryngology

## 2019-03-23 DIAGNOSIS — J329 Chronic sinusitis, unspecified: Secondary | ICD-10-CM

## 2019-04-06 ENCOUNTER — Ambulatory Visit
Admission: RE | Admit: 2019-04-06 | Discharge: 2019-04-06 | Disposition: A | Discharge status: Home / Self Care | Payer: Medicaid Other | Source: Ambulatory Visit | Attending: Otolaryngology | Admitting: Otolaryngology

## 2019-04-06 DIAGNOSIS — J3489 Other specified disorders of nose and nasal sinuses: Secondary | ICD-10-CM | POA: Diagnosis not present

## 2019-04-06 DIAGNOSIS — R0981 Nasal congestion: Secondary | ICD-10-CM | POA: Diagnosis not present

## 2019-04-06 DIAGNOSIS — J329 Chronic sinusitis, unspecified: Secondary | ICD-10-CM

## 2019-04-13 DIAGNOSIS — J322 Chronic ethmoidal sinusitis: Secondary | ICD-10-CM | POA: Diagnosis not present

## 2019-04-13 DIAGNOSIS — J320 Chronic maxillary sinusitis: Secondary | ICD-10-CM | POA: Diagnosis not present

## 2019-04-13 DIAGNOSIS — J321 Chronic frontal sinusitis: Secondary | ICD-10-CM | POA: Diagnosis not present

## 2019-04-13 DIAGNOSIS — J338 Other polyp of sinus: Secondary | ICD-10-CM | POA: Diagnosis not present

## 2019-04-24 ENCOUNTER — Other Ambulatory Visit: Payer: Self-pay | Admitting: Otolaryngology

## 2019-04-28 ENCOUNTER — Other Ambulatory Visit: Payer: Self-pay

## 2019-04-28 ENCOUNTER — Encounter (HOSPITAL_BASED_OUTPATIENT_CLINIC_OR_DEPARTMENT_OTHER): Payer: Self-pay | Admitting: Otolaryngology

## 2019-05-02 ENCOUNTER — Other Ambulatory Visit (HOSPITAL_COMMUNITY)
Admission: RE | Admit: 2019-05-02 | Discharge: 2019-05-02 | Disposition: A | Discharge status: Home / Self Care | Payer: Medicaid Other | Source: Ambulatory Visit | Attending: Otolaryngology | Admitting: Otolaryngology

## 2019-05-02 DIAGNOSIS — Z01812 Encounter for preprocedural laboratory examination: Secondary | ICD-10-CM | POA: Diagnosis not present

## 2019-05-02 DIAGNOSIS — Z20822 Contact with and (suspected) exposure to covid-19: Secondary | ICD-10-CM | POA: Insufficient documentation

## 2019-05-02 LAB — SARS CORONAVIRUS 2 (TAT 6-24 HRS): SARS Coronavirus 2: NEGATIVE

## 2019-05-05 ENCOUNTER — Encounter (HOSPITAL_BASED_OUTPATIENT_CLINIC_OR_DEPARTMENT_OTHER)
Admission: RE | Disposition: A | Discharge status: Home / Self Care | Payer: Self-pay | Source: Home / Self Care | Attending: Otolaryngology

## 2019-05-05 ENCOUNTER — Ambulatory Visit (HOSPITAL_BASED_OUTPATIENT_CLINIC_OR_DEPARTMENT_OTHER)
Admission: RE | Admit: 2019-05-05 | Discharge: 2019-05-05 | Disposition: A | Discharge status: Home / Self Care | Payer: Medicaid Other | Attending: Otolaryngology | Admitting: Otolaryngology

## 2019-05-05 ENCOUNTER — Encounter (HOSPITAL_BASED_OUTPATIENT_CLINIC_OR_DEPARTMENT_OTHER): Payer: Self-pay | Admitting: Otolaryngology

## 2019-05-05 ENCOUNTER — Ambulatory Visit (HOSPITAL_BASED_OUTPATIENT_CLINIC_OR_DEPARTMENT_OTHER): Payer: Medicaid Other | Admitting: Certified Registered"

## 2019-05-05 DIAGNOSIS — J320 Chronic maxillary sinusitis: Secondary | ICD-10-CM | POA: Diagnosis not present

## 2019-05-05 DIAGNOSIS — Z87891 Personal history of nicotine dependence: Secondary | ICD-10-CM | POA: Insufficient documentation

## 2019-05-05 DIAGNOSIS — Z6841 Body Mass Index (BMI) 40.0 and over, adult: Secondary | ICD-10-CM | POA: Insufficient documentation

## 2019-05-05 DIAGNOSIS — J321 Chronic frontal sinusitis: Secondary | ICD-10-CM | POA: Insufficient documentation

## 2019-05-05 DIAGNOSIS — J328 Other chronic sinusitis: Secondary | ICD-10-CM | POA: Diagnosis not present

## 2019-05-05 DIAGNOSIS — I10 Essential (primary) hypertension: Secondary | ICD-10-CM | POA: Diagnosis not present

## 2019-05-05 DIAGNOSIS — J338 Other polyp of sinus: Secondary | ICD-10-CM | POA: Diagnosis not present

## 2019-05-05 DIAGNOSIS — J322 Chronic ethmoidal sinusitis: Secondary | ICD-10-CM | POA: Diagnosis not present

## 2019-05-05 DIAGNOSIS — R519 Headache, unspecified: Secondary | ICD-10-CM | POA: Diagnosis not present

## 2019-05-05 DIAGNOSIS — J324 Chronic pansinusitis: Secondary | ICD-10-CM | POA: Diagnosis not present

## 2019-05-05 DIAGNOSIS — J0140 Acute pansinusitis, unspecified: Secondary | ICD-10-CM | POA: Diagnosis not present

## 2019-05-05 DIAGNOSIS — E785 Hyperlipidemia, unspecified: Secondary | ICD-10-CM | POA: Diagnosis not present

## 2019-05-05 HISTORY — PX: MAXILLARY ANTROSTOMY: SHX2003

## 2019-05-05 HISTORY — PX: SINUS ENDO WITH FUSION: SHX5329

## 2019-05-05 HISTORY — PX: CLOSURE ORAL ANTRAL FISTULA: SHX6286

## 2019-05-05 HISTORY — PX: ETHMOIDECTOMY: SHX5197

## 2019-05-05 LAB — POCT PREGNANCY, URINE: Preg Test, Ur: NEGATIVE

## 2019-05-05 SURGERY — CLOSURE, FISTULA, OROANTRAL
Anesthesia: General | Site: Nose | Laterality: Left

## 2019-05-05 MED ORDER — FENTANYL CITRATE (PF) 100 MCG/2ML IJ SOLN
INTRAMUSCULAR | Status: AC
  Filled 2019-05-05: qty 2

## 2019-05-05 MED ORDER — COCAINE HCL 4 % EX SOLN
CUTANEOUS | Status: DC | PRN
  Administered 2019-05-05: 4 mL via NASAL

## 2019-05-05 MED ORDER — OXYMETAZOLINE HCL 0.05 % NA SOLN
NASAL | Status: DC | PRN
  Administered 2019-05-05: 1 via TOPICAL

## 2019-05-05 MED ORDER — MUPIROCIN 2 % EX OINT
TOPICAL_OINTMENT | CUTANEOUS | Status: AC
  Filled 2019-05-05: qty 22

## 2019-05-05 MED ORDER — LIDOCAINE-EPINEPHRINE 1 %-1:100000 IJ SOLN
INTRAMUSCULAR | Status: AC
  Filled 2019-05-05: qty 1

## 2019-05-05 MED ORDER — LIDOCAINE 2% (20 MG/ML) 5 ML SYRINGE
INTRAMUSCULAR | Status: DC | PRN
  Administered 2019-05-05: 100 mg via INTRAVENOUS

## 2019-05-05 MED ORDER — MIDAZOLAM HCL 2 MG/2ML IJ SOLN
INTRAMUSCULAR | Status: AC
  Filled 2019-05-05: qty 2

## 2019-05-05 MED ORDER — LIDOCAINE 2% (20 MG/ML) 5 ML SYRINGE
INTRAMUSCULAR | Status: AC
  Filled 2019-05-05: qty 5

## 2019-05-05 MED ORDER — OXYCODONE-ACETAMINOPHEN 5-325 MG PO TABS: 1.0000 | ORAL_TABLET | ORAL | 0 refills | Status: AC | PRN

## 2019-05-05 MED ORDER — MUPIROCIN 2 % EX OINT
TOPICAL_OINTMENT | CUTANEOUS | Status: DC | PRN
  Administered 2019-05-05: 1 via NASAL

## 2019-05-05 MED ORDER — BSS IO SOLN
INTRAOCULAR | Status: DC | PRN
  Administered 2019-05-05: 1

## 2019-05-05 MED ORDER — CLINDAMYCIN PHOSPHATE 900 MG/50ML IV SOLN
INTRAVENOUS | Status: DC | PRN
  Administered 2019-05-05: 900 mg via INTRAVENOUS

## 2019-05-05 MED ORDER — OXYCODONE HCL 5 MG PO TABS
ORAL_TABLET | ORAL | Status: AC
  Filled 2019-05-05: qty 1

## 2019-05-05 MED ORDER — HYDROMORPHONE HCL 1 MG/ML IJ SOLN
INTRAMUSCULAR | Status: AC
  Filled 2019-05-05: qty 0.5

## 2019-05-05 MED ORDER — FENTANYL CITRATE (PF) 250 MCG/5ML IJ SOLN
INTRAMUSCULAR | Status: DC | PRN
  Administered 2019-05-05: 100 ug via INTRAVENOUS

## 2019-05-05 MED ORDER — SODIUM CHLORIDE 0.9 % IV SOLN
INTRAVENOUS | Status: AC | PRN
  Administered 2019-05-05: 100 mL

## 2019-05-05 MED ORDER — MEPERIDINE HCL 25 MG/ML IJ SOLN: 6.2500 mg | INTRAMUSCULAR | Status: DC | PRN

## 2019-05-05 MED ORDER — SUCCINYLCHOLINE CHLORIDE 200 MG/10ML IV SOSY
PREFILLED_SYRINGE | INTRAVENOUS | Status: AC
  Filled 2019-05-05: qty 20

## 2019-05-05 MED ORDER — PROPOFOL 10 MG/ML IV BOLUS
INTRAVENOUS | Status: AC
  Filled 2019-05-05: qty 20

## 2019-05-05 MED ORDER — COCAINE HCL 4 % EX SOLN
CUTANEOUS | Status: AC
  Filled 2019-05-05: qty 4

## 2019-05-05 MED ORDER — BSS IO SOLN
INTRAOCULAR | Status: AC
  Filled 2019-05-05: qty 15

## 2019-05-05 MED ORDER — ONDANSETRON HCL 4 MG/2ML IJ SOLN
INTRAMUSCULAR | Status: AC
  Filled 2019-05-05: qty 2

## 2019-05-05 MED ORDER — PROMETHAZINE HCL 25 MG/ML IJ SOLN: 6.2500 mg | INTRAMUSCULAR | Status: DC | PRN

## 2019-05-05 MED ORDER — OXYMETAZOLINE HCL 0.05 % NA SOLN
NASAL | Status: AC
  Filled 2019-05-05: qty 30

## 2019-05-05 MED ORDER — LACTATED RINGERS IV SOLN: INTRAVENOUS | Status: DC

## 2019-05-05 MED ORDER — MIDAZOLAM HCL 5 MG/5ML IJ SOLN
INTRAMUSCULAR | Status: DC | PRN
  Administered 2019-05-05: 2 mg via INTRAVENOUS

## 2019-05-05 MED ORDER — OXYCODONE HCL 5 MG/5ML PO SOLN: 5.0000 mg | Freq: Once | ORAL | Status: AC | PRN

## 2019-05-05 MED ORDER — FENTANYL CITRATE (PF) 100 MCG/2ML IJ SOLN: 50.0000 ug | INTRAMUSCULAR | Status: DC | PRN

## 2019-05-05 MED ORDER — PROPOFOL 10 MG/ML IV BOLUS
INTRAVENOUS | Status: DC | PRN
  Administered 2019-05-05: 170 mg via INTRAVENOUS

## 2019-05-05 MED ORDER — CLINDAMYCIN HCL 300 MG PO CAPS
300.0000 mg | ORAL_CAPSULE | Freq: Four times a day (QID) | ORAL | 0 refills | Status: AC
Start: 2019-05-05 — End: 2019-05-12

## 2019-05-05 MED ORDER — OXYCODONE HCL 5 MG PO TABS
5.0000 mg | ORAL_TABLET | Freq: Once | ORAL | Status: AC | PRN
  Administered 2019-05-05: 13:00:00 5 mg via ORAL

## 2019-05-05 MED ORDER — SUGAMMADEX SODIUM 200 MG/2ML IV SOLN
INTRAVENOUS | Status: DC | PRN
  Administered 2019-05-05: 200 mg via INTRAVENOUS

## 2019-05-05 MED ORDER — DEXAMETHASONE SODIUM PHOSPHATE 10 MG/ML IJ SOLN
INTRAMUSCULAR | Status: DC | PRN
  Administered 2019-05-05: 4 mg via INTRAVENOUS

## 2019-05-05 MED ORDER — ROCURONIUM BROMIDE 10 MG/ML (PF) SYRINGE
PREFILLED_SYRINGE | INTRAVENOUS | Status: DC | PRN
  Administered 2019-05-05: 100 mg via INTRAVENOUS

## 2019-05-05 MED ORDER — MIDAZOLAM HCL 2 MG/2ML IJ SOLN: 1.0000 mg | INTRAMUSCULAR | Status: DC | PRN

## 2019-05-05 MED ORDER — ONDANSETRON HCL 4 MG/2ML IJ SOLN
INTRAMUSCULAR | Status: DC | PRN
  Administered 2019-05-05: 4 mg via INTRAVENOUS

## 2019-05-05 MED ORDER — HYDROMORPHONE HCL 1 MG/ML IJ SOLN
0.2500 mg | INTRAMUSCULAR | Status: DC | PRN
  Administered 2019-05-05 (×2): 0.5 mg via INTRAVENOUS

## 2019-05-05 MED ORDER — CLINDAMYCIN PHOSPHATE 900 MG/50ML IV SOLN
INTRAVENOUS | Status: AC
  Filled 2019-05-05: qty 50

## 2019-05-05 SURGICAL SUPPLY — 58 items
BLADE RAD40 ROTATE 4M 4 5PK (BLADE) IMPLANT
BLADE RAD60 ROTATE M4 4 5PK (BLADE) IMPLANT
BLADE ROTATE RAD 12 4 M4 (BLADE) IMPLANT
BLADE ROTATE RAD 40 4 M4 (BLADE) IMPLANT
BLADE ROTATE TRICUT 4X13 M4 (BLADE) ×3 IMPLANT
BLADE TRICUT ROTATE M4 4 5PK (BLADE) IMPLANT
BUR HS RAD FRONTAL 3 (BURR) IMPLANT
CANISTER SUC SOCK COL 7IN (MISCELLANEOUS) ×6 IMPLANT
CANISTER SUCT 1200ML W/VALVE (MISCELLANEOUS) ×6 IMPLANT
COAGULATOR SUCT 8FR VV (MISCELLANEOUS) IMPLANT
COVER WAND RF STERILE (DRAPES) IMPLANT
DECANTER SPIKE VIAL GLASS SM (MISCELLANEOUS) ×1 IMPLANT
DRSG NASAL KENNEDY LMNT 8CM (GAUZE/BANDAGES/DRESSINGS) IMPLANT
DRSG NASOPORE 8CM (GAUZE/BANDAGES/DRESSINGS) IMPLANT
DRSG TELFA 3X8 NADH (GAUZE/BANDAGES/DRESSINGS) IMPLANT
ELECT REM PT RETURN 9FT ADLT (ELECTROSURGICAL) ×3
ELECTRODE REM PT RTRN 9FT ADLT (ELECTROSURGICAL) ×2 IMPLANT
GLOVE BIO SURGEON STRL SZ7.5 (GLOVE) ×4 IMPLANT
GLOVE BIOGEL PI IND STRL 7.0 (GLOVE) IMPLANT
GLOVE BIOGEL PI IND STRL 7.5 (GLOVE) IMPLANT
GLOVE BIOGEL PI INDICATOR 7.0 (GLOVE) ×1
GLOVE BIOGEL PI INDICATOR 7.5 (GLOVE) ×1
GLOVE ECLIPSE 6.5 STRL STRAW (GLOVE) ×1 IMPLANT
GLOVE SURG SS PI 7.0 STRL IVOR (GLOVE) ×1 IMPLANT
GOWN STRL REUS W/ TWL LRG LVL3 (GOWN DISPOSABLE) ×4 IMPLANT
GOWN STRL REUS W/TWL LRG LVL3 (GOWN DISPOSABLE) ×9
HEMOSTAT SURGICEL 2X14 (HEMOSTASIS) IMPLANT
IV CATH AUTO 14GX1.75 SAFE ORG (IV SOLUTION) IMPLANT
IV NS 500ML (IV SOLUTION) ×3
IV NS 500ML BAXH (IV SOLUTION) ×2 IMPLANT
NDL HYPO 25X1 1.5 SAFETY (NEEDLE) ×2 IMPLANT
NDL SPNL 25GX3.5 QUINCKE BL (NEEDLE) IMPLANT
NEEDLE HYPO 25X1 1.5 SAFETY (NEEDLE) IMPLANT
NEEDLE SPNL 25GX3.5 QUINCKE BL (NEEDLE) IMPLANT
NS IRRIG 1000ML POUR BTL (IV SOLUTION) ×3 IMPLANT
PACK ENT DAY SURGERY (CUSTOM PROCEDURE TRAY) ×3 IMPLANT
PAD DRESSING TELFA 3X8 NADH (GAUZE/BANDAGES/DRESSINGS) IMPLANT
SET BASIN DAY SURGERY F.S. (CUSTOM PROCEDURE TRAY) ×3 IMPLANT
SLEEVE SCD COMPRESS KNEE MED (MISCELLANEOUS) ×3 IMPLANT
SOLUTION BUTLER CLEAR DIP (MISCELLANEOUS) ×3 IMPLANT
SPLINT NASAL AIRWAY SILICONE (MISCELLANEOUS) IMPLANT
SPONGE GAUZE 2X2 8PLY STRL LF (GAUZE/BANDAGES/DRESSINGS) ×3 IMPLANT
SPONGE NEURO XRAY DETECT 1X3 (DISPOSABLE) ×3 IMPLANT
SUCTION FRAZIER HANDLE 10FR (MISCELLANEOUS)
SUCTION TUBE FRAZIER 10FR DISP (MISCELLANEOUS) IMPLANT
SUT CHROMIC 4 0 P 3 18 (SUTURE) IMPLANT
SUT CHROMIC 4 0 PS 2 18 (SUTURE) IMPLANT
SUT PLAIN 4 0 ~~LOC~~ 1 (SUTURE) IMPLANT
SUT PROLENE 3 0 PS 2 (SUTURE) IMPLANT
SUT VIC AB 4-0 PS2 27 (SUTURE) ×1 IMPLANT
SYR 50ML LL SCALE MARK (SYRINGE) ×3 IMPLANT
TOWEL GREEN STERILE FF (TOWEL DISPOSABLE) ×4 IMPLANT
TRACKER ENT INSTRUMENT (MISCELLANEOUS) ×3 IMPLANT
TRACKER ENT PATIENT (MISCELLANEOUS) ×3 IMPLANT
TUBE CONNECTING 20X1/4 (TUBING) ×3 IMPLANT
TUBE SALEM SUMP 16 FR W/ARV (TUBING) ×1 IMPLANT
TUBING STRAIGHTSHOT EPS 5PK (TUBING) ×3 IMPLANT
YANKAUER SUCT BULB TIP NO VENT (SUCTIONS) ×3 IMPLANT

## 2019-05-05 NOTE — Anesthesia Preprocedure Evaluation (Signed)
Anesthesia Evaluation  Patient identified by MRN, date of birth, ID band Patient awake    Reviewed: Allergy & Precautions, NPO status , Patient's Chart, lab work & pertinent test results  Airway Mallampati: II  TM Distance: >3 FB Neck ROM: Full    Dental no notable dental hx.    Pulmonary neg pulmonary ROS, former smoker,    Pulmonary exam normal breath sounds clear to auscultation       Cardiovascular hypertension, negative cardio ROS Normal cardiovascular exam Rhythm:Regular Rate:Normal     Neuro/Psych  Headaches, negative psych ROS   GI/Hepatic negative GI ROS, Neg liver ROS,   Endo/Other  Morbid obesity  Renal/GU negative Renal ROS  negative genitourinary   Musculoskeletal negative musculoskeletal ROS (+)   Abdominal (+) + obese,   Peds negative pediatric ROS (+)  Hematology negative hematology ROS (+)   Anesthesia Other Findings   Reproductive/Obstetrics negative OB ROS                             Anesthesia Physical Anesthesia Plan  ASA: III  Anesthesia Plan: General   Post-op Pain Management:    Induction: Intravenous  PONV Risk Score and Plan: 3 and Ondansetron, Dexamethasone, Midazolam and Treatment may vary due to age or medical condition  Airway Management Planned: Oral ETT  Additional Equipment:   Intra-op Plan:   Post-operative Plan: Extubation in OR  Informed Consent: I have reviewed the patients History and Physical, chart, labs and discussed the procedure including the risks, benefits and alternatives for the proposed anesthesia with the patient or authorized representative who has indicated his/her understanding and acceptance.     Dental advisory given  Plan Discussed with: CRNA  Anesthesia Plan Comments:         Anesthesia Quick Evaluation

## 2019-05-05 NOTE — H&P (Signed)
Cc: Chronic sinusitis, oroantral fistula  HPI: The patient is a 39 year old female who returns today for her follow-up evaluation. The patient was last seen 4 weeks ago.  At that time, she was noted to have possible left oroantral fistula.  Her dental x-ray showed complete opacification of her left maxillary sinus.  The patient was also complaining of chronic left-sided facial pain.  She was treated with multiple courses of antibiotics and steroids.  The patient subsequently underwent a sinus CT scan.  The CT showed significant opacification of her left maxillary, ethmoid, and frontal sinuses.  An oroantral fistula was also noted on the CT scan.  The patient returns today complaining of persistent symptoms.  She has noted purulent drainage from the fistula into her mouth.  She continues to have left-sided facial pain and pressure.  Her symptoms did not improve with her antibiotic treatment in the past.   Exam: The flexible scope was inserted into the right nasal cavity.  Endoscopy of the interior nasal cavity, superior, inferior, and middle meatus was performed. The sphenoid-ethmoid recess was examined. Edematous mucosa was noted.  No mass, or lesion was appreciated. Olfactory cleft was clear.  Nasopharynx was clear.  Turbinates were hypertrophied but without mass.  The procedure was repeated on the left side. Polypoid tissue was noted to obstruct the left middle meatus.  The patient tolerated the procedure well.  Instructions were given to avoid eating or drinking for 2 hours.  Assessment: 1.  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  2.  Left oroantral fistula.    Plan: 1.  The nasal endoscopy findings and the CT images are reviewed with the patient.  2.  Based on the above findings, the patient will likely need to undergo surgical treatment with left-sided endoscopic sinus surgery and surgical repair of the oroantral fistula.   The risks, benefits, alternatives and details of the procedure are reviewed with the patient.  Questions are invited and answered.  3.  The patient would like to proceed with the procedure.

## 2019-05-05 NOTE — Anesthesia Procedure Notes (Signed)
Procedure Name: Intubation Date/Time: 05/05/2019 10:17 AM Performed by: Myna Bright, CRNA Pre-anesthesia Checklist: Patient identified, Emergency Drugs available, Suction available and Patient being monitored Patient Re-evaluated:Patient Re-evaluated prior to induction Oxygen Delivery Method: Circle system utilized Preoxygenation: Pre-oxygenation with 100% oxygen Induction Type: IV induction Ventilation: Mask ventilation without difficulty Laryngoscope Size: Mac and 3 Grade View: Grade I Tube type: Oral Tube size: 7.0 mm Number of attempts: 1 Placement Confirmation: ETT inserted through vocal cords under direct vision,  positive ETCO2 and breath sounds checked- equal and bilateral Secured at: 21 cm Tube secured with: Tape Dental Injury: Teeth and Oropharynx as per pre-operative assessment

## 2019-05-05 NOTE — Op Note (Signed)
DATE OF PROCEDURE: 05/05/2019  OPERATIVE REPORT   SURGEON: Newman Pies, MD   PREOPERATIVE DIAGNOSES:  1. Left oroantral fistula 2. Chronic left maxillary, ethmoid, and frontal sinusitis.  POSTOPERATIVE DIAGNOSES:  1. Left oroantral fistula 2. Chronic left maxillary, ethmoid, and frontal sinusitis.  PROCEDURE PERFORMED:  1.  Left endoscopic frontal sinusotomy and total ethmoidectomy. 2.  Left endoscopic maxillary antrostomy with polyp removal. 3.  Left orontral fistula repair. 4.  FUSION stereotactic image guidance.  ANESTHESIA: General endotracheal tube anesthesia.   COMPLICATIONS: None.   ESTIMATED BLOOD LOSS: 250 mL.   INDICATION FOR PROCEDURE: Denise Berger is a 39 y.o. 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  She was complaining of persistent left-sided facial pain and purulent nasal drainage.  She also noted purulent drainage from her oroantral fistula.  On her CT scan, she was noted to have significant opacifications of her left maxillary, ethmoid, and frontal sinuses.  The patient was previously treated with multiple courses of antibiotics without improvement in her symptoms.  Based on the above findings, the decision was made for the patient to undergo the above-stated procedures. The risks, benefits, alternatives, and details of the procedures were discussed with the patient. Questions were invited and answered. Informed consent was obtained.   DESCRIPTION OF PROCEDURE: The patient was taken to the operating room and placed supine on the operating table. General endotracheal tube anesthesia was administered by the anesthesiologist. The patient was positioned, and prepped and draped in the standard fashion for nasal surgery. Pledgets soaked with Afrin were placed in both nasal cavities for decongestion. The pledgets were subsequently removed. The FUSION stereotactic image guidance marker was placed. The image guidance system was functional  throughout the case.  Attention was first focused on the left oral antral fistula.  The oral cavity was exposed with a mouthgag.  An oral antral fistula was noted at her previous third maxillary molar extraction site.  The edges of the oral antral fistula was freshened.  A relaxing incision was made adjacent to the fistula.  The mucosal surface was carefully undermined.  The fistula was closed with interrupted 4-0 Vicryl sutures.  Attention was then focused on the left nasal cavity. Using a 0 endoscope, the left nasal cavity was examined. A large middle turbinate was noted. Using Tru-Cut forceps, the inferior one third and medial one half of the middle turbinate was resected. Polypoid tissue was noted within the middle meatus. The polypoid tissue was removed using a combination of microdebrider and Blakesley forceps. The uncinate process was resected with a freer elevator. The maxillary antrum was entered and enlarged using a combination of backbiter and microdebrider. Polypoid tissue and purulent drainage were removed from the left maxillary sinus.  Attention was then focused on the ethmoid sinuses. The bony partitions of the anterior and posterior ethmoid cavities were taken down. Polypoid tissue was noted and removed.  Attention was then focused on the frontal sinus. The frontal recess was identified and enlarged by removing the surrounding bony partitions.  Purulent drainage was suctioned from the frontal recess. Thesinuses were copiously irrigated with saline solution.  The care of the patient was turned over to the anesthesiologist. The patient was awakened from anesthesia without difficulty. The patient was extubated and transferred to the recovery room in good condition.   OPERATIVE FINDINGS: Left oroantral fistula.  Left chronic maxillary, ethmoid, and frontal sinusitis and polyposis.  SPECIMEN: Left sinus contents.   FOLLOWUP CARE: The patient be  discharged home once she is awake and alert.  The patient will be placed on Percocet p.r.n. pain, and clindamycin for 7 days. The patient will follow up in my office in 1 week.  Zak Gondek Raynelle Bring, MD

## 2019-05-05 NOTE — Anesthesia Postprocedure Evaluation (Signed)
Anesthesia Post Note  Patient: Denise Berger  Procedure(s) Performed: OROANTRAL FISTULA REPAIR (Left Mouth) LEFT ENDOSCOPIC MAXILLARY ANTROSTOMY WITH TISSUE REMOVAL   (Left Nose) LEFT ETHMOIDECTOMY AND FRONTAL RECESS (Left Nose) SINUS ENDO WITH FUSION (Left Nose)     Patient location during evaluation: PACU Anesthesia Type: General Level of consciousness: awake and alert Pain management: pain level controlled Vital Signs Assessment: post-procedure vital signs reviewed and stable Respiratory status: spontaneous breathing, nonlabored ventilation and respiratory function stable Cardiovascular status: blood pressure returned to baseline and stable Postop Assessment: no apparent nausea or vomiting Anesthetic complications: no    Last Vitals:  Vitals:   05/05/19 1230 05/05/19 1259  BP: (!) 149/86 139/74  Pulse: 75 71  Resp: 16 16  Temp:  36.7 C  SpO2: 97% 99%    Last Pain:  Vitals:   05/05/19 1245  TempSrc:   PainSc: 3                  Lowella Curb

## 2019-05-05 NOTE — Discharge Instructions (Addendum)
---------------  POSTOPERATIVE INSTRUCTIONS FOR PATIENTS HAVING NASAL OR SINUS OPERATIONS ACTIVITY: Restrict activity at home for the first two days, resting as much as possible. Light activity is best. You may usually return to work within a week. You should refrain from nose blowing, strenuous activity, or heavy lifting greater than 20lbs for a total of one week after your operation.  If sneezing cannot be avoided, sneeze with your mouth open. DISCOMFORT: You may experience a dull headache and pressure along with nasal congestion and discharge. These symptoms may be worse during the first week after the operation but may last as long as two to four weeks.  Please take Tylenol or the pain medication that has been prescribed for you. Do not take aspirin or aspirin containing medications since they may cause bleeding.  You may experience symptoms of post nasal drainage, nasal congestion, headaches and fatigue for two or three months after your operation.  BLEEDING: You may have some blood tinged nasal drainage for approximately two weeks after the operation.  The discharge will be worse for the first week.  Please call our office at [phone] or go to the nearest hospital emergency room if you experience any of the following: heavy, bright red blood from your nose or mouth that lasts longer than 15 minutes or coughing up or vomiting bright red blood or blood clots. GENERAL CONSIDERATIONS: 1. A gauze dressing will be placed on your upper lip to absorb any drainage after the operation. You may need to change this several times a day.  If you do not have very much drainage, you may remove the dressing.  Remember that you may gently wipe your nose with a tissue and sniff in, but DO NOT blow your nose. 2. Please keep all of your postoperative appointments.  Your final results after the operation will depend on proper follow-up.  The initial visit is usually 2 to 5 days after the operation.  During this  visit, the remaining nasal packing and internal septal splints will be removed.  Your nasal and sinus cavities will be cleaned.  During the second visit, your nasal and sinus cavities will be cleaned again. Have someone drive you to your first two postoperative appointments.  3. How you care for your nose after the operation will influence the results that you obtain.  You should follow all directions, take your medication as prescribed, and call our office [phone] with any problems or questions. 4. You may be more comfortable sleeping with your head elevated on two pillows. 5. Do not take any medications that we have not prescribed or recommended. WARNING SIGNS: if any of the following should occur, please call our office: 1. Persistent fever greater than 102F. 2. Persistent vomiting. 3. Severe and constant pain that is not relieved by prescribed pain medication. 4. Trauma to the nose. 5. Rash or unusual side effects from any medicines.   Post Anesthesia Home Care Instructions  Activity: Get plenty of rest for the remainder of the day. A responsible individual must stay with you for 24 hours following the procedure.  For the next 24 hours, DO NOT: -Drive a car -Advertising copywriter -Drink alcoholic beverages -Take any medication unless instructed by your physician -Make any legal decisions or sign important papers.  Meals: Start with liquid foods such as gelatin or soup. Progress to regular foods as tolerated. Avoid greasy, spicy, heavy foods. If nausea and/or vomiting occur, drink only clear liquids until the nausea and/or vomiting subsides. Call your  physician if vomiting continues.  Special Instructions/Symptoms: Your throat may feel dry or sore from the anesthesia or the breathing tube placed in your throat during surgery. If this causes discomfort, gargle with warm salt water. The discomfort should disappear within 24 hours.  Call your surgeon if you experience:   1.  Fever  over 101.0. 2.  Inability to urinate. 3.  Nausea and/or vomiting. 4.  Extreme swelling or bruising at the surgical site. 5.  Continued bleeding from the incision. 6.  Increased pain, redness or drainage from the incision. 7.  Problems related to your pain medication. 8.  Any problems and/or concerns

## 2019-05-05 NOTE — Transfer of Care (Signed)
Immediate Anesthesia Transfer of Care Note  Patient: Denise Berger  Procedure(s) Performed: OROANTRAL FISTULA REPAIR (Left Mouth) LEFT ENDOSCOPIC MAXILLARY ANTROSTOMY WITH TISSUE REMOVAL   (Left Nose) LEFT ETHMOIDECTOMY AND FRONTAL RECESS (Left Nose) SINUS ENDO WITH FUSION (Left Nose)  Patient Location: PACU  Anesthesia Type:GEN  Level of Consciousness: awake, alert  and oriented  Airway & Oxygen Therapy: Patient Spontanous Breathing and Patient connected to face mask oxygen  Post-op Assessment: Report given to RN and Post -op Vital signs reviewed and stable  Post vital signs: Reviewed and stable  Last Vitals:  Vitals Value Taken Time  BP 139/80 05/05/19 1152  Temp    Pulse 81 05/05/19 1155  Resp 17 05/05/19 1155  SpO2 100 % 05/05/19 1155  Vitals shown include unvalidated device data.  Last Pain:  Vitals:   05/05/19 0929  TempSrc: Temporal  PainSc: 0-No pain         Complications: No apparent anesthesia complications

## 2019-05-08 ENCOUNTER — Encounter: Payer: Self-pay | Admitting: *Deleted

## 2019-05-08 LAB — SURGICAL PATHOLOGY

## 2019-05-11 DIAGNOSIS — J322 Chronic ethmoidal sinusitis: Secondary | ICD-10-CM | POA: Diagnosis not present

## 2019-05-11 DIAGNOSIS — J320 Chronic maxillary sinusitis: Secondary | ICD-10-CM | POA: Diagnosis not present

## 2019-05-11 DIAGNOSIS — J338 Other polyp of sinus: Secondary | ICD-10-CM | POA: Diagnosis not present

## 2019-05-11 DIAGNOSIS — J321 Chronic frontal sinusitis: Secondary | ICD-10-CM | POA: Diagnosis not present

## 2019-06-01 DIAGNOSIS — J320 Chronic maxillary sinusitis: Secondary | ICD-10-CM | POA: Diagnosis not present

## 2019-06-01 DIAGNOSIS — J338 Other polyp of sinus: Secondary | ICD-10-CM | POA: Diagnosis not present

## 2019-06-01 DIAGNOSIS — J322 Chronic ethmoidal sinusitis: Secondary | ICD-10-CM | POA: Diagnosis not present

## 2019-06-01 DIAGNOSIS — J321 Chronic frontal sinusitis: Secondary | ICD-10-CM | POA: Diagnosis not present

## 2019-06-21 ENCOUNTER — Emergency Department (INDEPENDENT_AMBULATORY_CARE_PROVIDER_SITE_OTHER): Payer: Medicaid Other

## 2019-06-21 ENCOUNTER — Emergency Department
Admission: RE | Admit: 2019-06-21 | Discharge: 2019-06-21 | Disposition: A | Discharge status: Home / Self Care | Payer: Medicaid Other | Source: Ambulatory Visit

## 2019-06-21 ENCOUNTER — Other Ambulatory Visit: Payer: Self-pay

## 2019-06-21 VITALS — BP 137/86 | HR 77 | Temp 98.6°F | Resp 17 | Ht 60.0 in | Wt 220.0 lb

## 2019-06-21 DIAGNOSIS — M25562 Pain in left knee: Secondary | ICD-10-CM | POA: Diagnosis not present

## 2019-06-21 DIAGNOSIS — W1843XA Slipping, tripping and stumbling without falling due to stepping from one level to another, initial encounter: Secondary | ICD-10-CM

## 2019-06-21 DIAGNOSIS — M25552 Pain in left hip: Secondary | ICD-10-CM

## 2019-06-21 DIAGNOSIS — S8992XA Unspecified injury of left lower leg, initial encounter: Secondary | ICD-10-CM | POA: Diagnosis not present

## 2019-06-21 DIAGNOSIS — S79912A Unspecified injury of left hip, initial encounter: Secondary | ICD-10-CM | POA: Diagnosis not present

## 2019-06-21 MED ORDER — MELOXICAM 15 MG PO TABS: 15.0000 mg | ORAL_TABLET | Freq: Every day | ORAL | 0 refills | Status: DC

## 2019-06-21 NOTE — ED Provider Notes (Signed)
[facility]    CSN: [id number] Arrival date & time: 06/21/19  1352      History   Chief Complaint Chief Complaint  Patient presents with  . Appointment  . Knee Injury  . Hip Pain    HPI Denise Berger is a 39 y.o. female.   HPI  Denise Berger is a 39 y.o. female presenting to UC with c/o Left side knee and hip pain that has been intermittent for about 2 weeks. Pt reports hx of sciatica but also reports slipping while getting into her husband's raise truck, hitting her knee on the chrome foot step.  She has also started walking 2 miles at a time to help with weight loss.  Pain is aching and throbbing, 3/10. Pt wants to make sure her knee is okay so she can continue to walk for exercise.  She has not taken any pain medication today.    Past Medical History:  Diagnosis Date  . Hypertension    no longer due to weight loss has improved BP    Patient Active Problem List   Diagnosis Date Noted  . Hyperlipidemia with target LDL less than 100 10/26/2013  . Essential hypertension, benign 07/03/2013  . Migraines 10/06/2012    Past Surgical History:  Procedure Laterality Date  . APPENDECTOMY  age 39  . CLOSURE ORAL ANTRAL FISTULA Left 05/05/2019   Procedure: OROANTRAL FISTULA REPAIR;  Surgeon: Newman Pies, MD;  Location: [facility];  Service: ENT;  Laterality: Left;  . ETHMOIDECTOMY Left 05/05/2019   Procedure: LEFT ETHMOIDECTOMY AND FRONTAL RECESS;  Surgeon: Newman Pies, MD;  Location: [facility];  Service: ENT;  Laterality: Left;  . HERNIA REPAIR Right age 89   RLQ incisional hernia repair with mesh placement  . laproscopic abd surgery     to remove scar tissue  . MAXILLARY ANTROSTOMY Left 05/05/2019   Procedure: LEFT ENDOSCOPIC MAXILLARY ANTROSTOMY WITH TISSUE REMOVAL  ;  Surgeon: Newman Pies, MD;  Location: [facility];  Service: ENT;  Laterality: Left;  . SINUS ENDO WITH FUSION Left 05/05/2019   Procedure: SINUS ENDO WITH FUSION;   Surgeon: Newman Pies, MD;  Location: [facility];  Service: ENT;  Laterality: Left;    OB History   No obstetric history on file.      Home Medications    Prior to Admission medications   Medication Sig Start Date End Date Taking? Authorizing Provider  meloxicam (MOBIC) 15 MG tablet Take 1 tablet (15 mg total) by mouth daily. 06/21/19   Lurene Shadow, PA-C    Family History Family History  Problem Relation Age of Onset  . Cancer Mother        cervical  . Diabetes Father   . Fibroids Sister        uterine  . Healthy Sister     Social History Social History   Tobacco Use  . Smoking status: Former Smoker    Years: 2.00    Types: Cigarettes    Quit date: 09/30/2002    Years since quitting: 16.7  . Smokeless tobacco: Never Used  Vaping Use  . Vaping Use: Never used  Substance Use Topics  . Alcohol use: No  . Drug use: No     Allergies   Penicillins and Sulfa antibiotics   Review of Systems Review of Systems  Musculoskeletal: Positive for arthralgias and joint swelling. Negative for back pain.  Skin: Negative for color change and wound.  Neurological: Negative for weakness and numbness.     Physical Exam Triage Vital Signs ED Triage Vitals  Enc Vitals Group     BP 06/21/19 1410 137/86     Pulse Rate 06/21/19 1410 77     Resp 06/21/19 1410 17     Temp 06/21/19 1410 98.6 F (37 C)     Temp Source 06/21/19 1410 Oral     SpO2 --      Weight 06/21/19 1411 220 lb (99.8 kg)     Height 06/21/19 1411 5' (1.524 m)     Head Circumference --      Peak Flow --      Pain Score 06/21/19 1410 3     Pain Loc --      Pain Edu? --      Excl. in [place]? --    No data found.  Updated Vital Signs BP 137/86 (BP Location: Right Arm)   Pulse 77   Temp 98.6 F (37 C) (Oral)   Resp 17   Ht 5' (1.524 m)   Wt 220 lb (99.8 kg)   LMP 06/14/2019 (Exact Date)   BMI 42.97 kg/m   Visual Acuity Right Eye Distance:   Left Eye Distance:   Bilateral Distance:      Right Eye Near:   Left Eye Near:    Bilateral Near:     Physical Exam Vitals and nursing note reviewed.  Constitutional:      Appearance: Normal appearance. She is well-developed.  HENT:     Head: Normocephalic and atraumatic.  Cardiovascular:     Rate and Rhythm: Normal rate.  Pulmonary:     Effort: Pulmonary effort is normal.  Musculoskeletal:        General: Normal range of motion.     Cervical back: Normal range of motion.     Comments: No spinal tenderness. Left hip: tender, full ROM w/o crepitus. Tenderness to lateral musculature of thigh. Left knee: mild edema, tenderness to anterior aspect. Full ROM w/o crepitus. Calf is soft, non-tender.  Skin:    General: Skin is warm and dry.     Findings: No bruising or erythema.  Neurological:     Mental Status: She is alert and oriented to person, place, and time.  Psychiatric:        Behavior: Behavior normal.      UC Treatments / Results  Labs (all labs ordered are listed, but only abnormal results are displayed) Labs Reviewed - No data to display  EKG   Radiology DG Knee Complete 4 Views Left  Result Date: 06/21/2019 CLINICAL DATA:  Left hip and knee pain since falling 2 weeks ago. EXAM: LEFT KNEE - COMPLETE 4+ VIEW COMPARISON:  None. FINDINGS: The mineralization and alignment are normal. There is no evidence of acute fracture or dislocation. The joint spaces are preserved. No joint effusion or focal soft tissue abnormality identified. IMPRESSION: Normal left knee radiographs. Electronically Signed   By: Richardean Sale M.D.   On: 06/21/2019 15:23   DG Hip Unilat W or Wo Pelvis 2-3 Views Left  Result Date: 06/21/2019 CLINICAL DATA:  Left hip and knee pain since falling 2 weeks ago. EXAM: DG HIP (WITH OR WITHOUT PELVIS) 2-3V LEFT COMPARISON:  None. FINDINGS: The mineralization and alignment are normal. There is no evidence of acute fracture or dislocation. The hip joint spaces are preserved. The sacroiliac joints  appear normal. No evidence of femoral head avascular necrosis. The soft tissues appear unremarkable. IMPRESSION: Negative  left hip radiographs. Electronically Signed   By: Carey Bullocks M.D.   On: 06/21/2019 15:22    Procedures Procedures (including critical care time)  Medications Ordered in UC Medications - No data to display  Initial Impression / Assessment and Plan / UC Course  I have reviewed the triage vital signs and the nursing notes.  Pertinent labs & imaging results that were available during my care of the patient were reviewed by me and considered in my medical decision making (see chart for details).     Discussed imaging with pt Encouraged conservative tx F/u with Sports Medicine AVS provided.  Final Clinical Impressions(s) / UC Diagnoses   Final diagnoses:  Left hip pain  Acute pain of left knee     Discharge Instructions      Meloxicam (Mobic) is an antiinflammatory to help with pain and inflammation.  Do not take ibuprofen, Advil, Aleve, or any other medications that contain NSAIDs while taking meloxicam as this may cause stomach upset or even ulcers if taken in large amounts for an extended period of time.   Call to schedule an appointment with Sports Medicine in 1-2 weeks if not improving.     ED Prescriptions    Medication Sig Dispense Auth. Provider   meloxicam (MOBIC) 15 MG tablet Take 1 tablet (15 mg total) by mouth daily. 14 tablet Lurene Shadow, [place]     I have reviewed the PDMP during this encounter.   Lurene Shadow, [place] 06/21/19 1958

## 2019-06-21 NOTE — ED Triage Notes (Signed)
C/o left hip pain -pt thinks it is sciatic pain Pt also c/o left knee pain after slipped getting into her husband's truck Describes hip pain as shooting & knee as throbbing Tylenol & Motrin OTC for pain  No meds since 1530 yesterday No COVID vaccine

## 2019-06-21 NOTE — Discharge Instructions (Signed)
  Meloxicam (Mobic) is an antiinflammatory to help with pain and inflammation.  Do not take ibuprofen, Advil, Aleve, or any other medications that contain NSAIDs while taking meloxicam as this may cause stomach upset or even ulcers if taken in large amounts for an extended period of time.   Call to schedule an appointment with Sports Medicine in 1-2 weeks if not improving.

## 2019-06-26 ENCOUNTER — Ambulatory Visit: Payer: Medicaid Other | Admitting: Nurse Practitioner

## 2019-06-30 ENCOUNTER — Ambulatory Visit: Payer: Medicaid Other | Admitting: Nurse Practitioner

## 2019-07-12 ENCOUNTER — Other Ambulatory Visit: Payer: Self-pay

## 2019-07-12 ENCOUNTER — Ambulatory Visit (INDEPENDENT_AMBULATORY_CARE_PROVIDER_SITE_OTHER): Payer: Medicaid Other | Admitting: Sports Medicine

## 2019-07-12 ENCOUNTER — Encounter: Payer: Self-pay | Admitting: Sports Medicine

## 2019-07-12 DIAGNOSIS — M2242 Chondromalacia patellae, left knee: Secondary | ICD-10-CM | POA: Diagnosis not present

## 2019-07-12 MED ORDER — DICLOFENAC SODIUM 75 MG PO TBEC: 75.0000 mg | DELAYED_RELEASE_TABLET | Freq: Two times a day (BID) | ORAL | 3 refills | Status: DC

## 2019-07-12 NOTE — Progress Notes (Signed)
    Procedures performed today:    None.  Independent interpretation of notes and tests performed by another provider:   None.  Brief History, Exam, Impression, and Recommendations:    Chondromalacia of patellofemoral joint, left This is a very pleasant 39 year old female, for some time now she said pain in her left knee, with radiation up to the left hip. Worse going up stairs, squatting, she does get significant gelling. On exam she has no pain over the greater trochanter, she does have tenderness at the patellar facets, painful patellar compression test and positive patellar grind. Ligaments are stable, negative McMurray sign. We will start conservatively, switching from meloxicam to Voltaren, adding aggressive formal physical therapy and medicine. She declines injection. Return to see me in a month, we will further discuss interventional treatment at that time.    ___________________________________________ Ihor Austin. Benjamin Stain, M.D., ABFM., CAQSM. Primary Care and Sports Medicine [facility]  Adjunct Instructor of Family Medicine  [employer]

## 2019-07-12 NOTE — Assessment & Plan Note (Signed)
This is a very pleasant 39 year old female, for some time now she said pain in her left knee, with radiation up to the left hip. Worse going up stairs, squatting, she does get significant gelling. On exam she has no pain over the greater trochanter, she does have tenderness at the patellar facets, painful patellar compression test and positive patellar grind. Ligaments are stable, negative McMurray sign. We will start conservatively, switching from meloxicam to Voltaren, adding aggressive formal physical therapy and medicine. She declines injection. Return to see me in a month, we will further discuss interventional treatment at that time.

## 2019-07-17 ENCOUNTER — Telehealth: Payer: Self-pay | Admitting: Sports Medicine

## 2019-07-17 NOTE — Telephone Encounter (Signed)
Received Fax 7/9 for PA on Diclofenac Tabs sent through cover my meds waiting on determination. - CF

## 2019-07-19 ENCOUNTER — Encounter: Payer: Self-pay | Admitting: Physical Therapy

## 2019-07-19 ENCOUNTER — Other Ambulatory Visit: Payer: Self-pay

## 2019-07-19 ENCOUNTER — Ambulatory Visit: Payer: Medicaid Other | Attending: Sports Medicine | Admitting: Physical Therapy

## 2019-07-19 DIAGNOSIS — R262 Difficulty in walking, not elsewhere classified: Secondary | ICD-10-CM | POA: Diagnosis present

## 2019-07-19 DIAGNOSIS — M25552 Pain in left hip: Secondary | ICD-10-CM | POA: Diagnosis present

## 2019-07-19 DIAGNOSIS — M25562 Pain in left knee: Secondary | ICD-10-CM | POA: Diagnosis not present

## 2019-07-19 NOTE — Therapy (Signed)
[facility] [address] [employer] [place], [place], [address] Phone: [phone]   Fax:  [phone]  Physical Therapy Evaluation  Patient Details  Name: Denise Berger MRN: [id number] Date of Birth: [birth year] Referring Provider (PT): Darlin Priestly, MD   Encounter Date: 07/19/2019   PT End of Session - 07/19/19 1426    Visit Number 1    Number of Visits 12    Date for PT Re-Evaluation 08/30/19    Authorization Type MCD    PT Start Time 1345    PT Stop Time 1430    PT Time Calculation (min) 45 min    Activity Tolerance Patient tolerated treatment well    Behavior During Therapy [facility] for tasks assessed/performed           Past Medical History:  Diagnosis Date  . Hypertension    no longer due to weight loss has improved BP    Past Surgical History:  Procedure Laterality Date  . APPENDECTOMY  age 31  . CLOSURE ORAL ANTRAL FISTULA Left 05/05/2019   Procedure: OROANTRAL FISTULA REPAIR;  Surgeon: Newman Pies, MD;  Location: [facility];  Service: ENT;  Laterality: Left;  . ETHMOIDECTOMY Left 05/05/2019   Procedure: LEFT ETHMOIDECTOMY AND FRONTAL RECESS;  Surgeon: Newman Pies, MD;  Location: [facility];  Service: ENT;  Laterality: Left;  . HERNIA REPAIR Right age 62   RLQ incisional hernia repair with mesh placement  . laproscopic abd surgery     to remove scar tissue  . MAXILLARY ANTROSTOMY Left 05/05/2019   Procedure: LEFT ENDOSCOPIC MAXILLARY ANTROSTOMY WITH TISSUE REMOVAL  ;  Surgeon: Newman Pies, MD;  Location: [facility];  Service: ENT;  Laterality: Left;  . SINUS ENDO WITH FUSION Left 05/05/2019   Procedure: SINUS ENDO WITH FUSION;  Surgeon: Newman Pies, MD;  Location: [facility];  Service: ENT;  Laterality: Left;    There were no vitals filed for this visit.    Subjective Assessment - 07/19/19 1349    Subjective Pt arriving to therapy reporting hitting her knee as she was getting in  her husband's truck. [place] walking on the sand. Pt reporting she has a history of chronic hip pain that comes and goes. Pt reporting she occassionaly has to get someone else to move her hip following sitting prolonged with her legs folded under her.    Pertinent History HTN    Limitations Walking;Sitting    Patient Stated Goals X-ray were unremarkable    Currently in Pain? Yes    Pain Score 3     Pain Location Knee    Pain Orientation Left    Pain Descriptors / Indicators Aching    Pain Type Acute pain    Pain Radiating Towards occassionally periods where R foot is numb    Pain Onset More than a month ago    Pain Frequency Intermittent    Aggravating Factors  it's worse at night while trying to sleep, (pt is side sleeper), after sitting prolonged, standing long periods    Pain Relieving Factors rubbing, heat, change positions, anti-inflammatory              [facility] PT Assessment - 07/19/19 0001      Assessment   Medical Diagnosis L chondromalacia L patella   M22.42   Referring Provider (PT) Darlin Priestly, MD    Onset Date/Surgical Date 06/06/19    Hand Dominance Right  Prior Therapy no      Precautions   Precautions None      Balance Screen   Has the patient fallen in the past 6 months Yes    How many times? 2    Is the patient reluctant to leave their home because of a fear of falling?  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    Leisure walk, swim      Cognition   Overall Cognitive Status Within Functional Limits for tasks assessed      Observation/Other Assessments   Focus on Therapeutic Outcomes (FOTO)  deferred due to  insurance      Posture/Postural Control   Posture/Postural Control Postural limitations    Postural Limitations Rounded Shoulders;Forward head;Increased lumbar lordosis      ROM / Strength   AROM / PROM / Strength AROM;Strength      AROM   AROM Assessment Site Knee    Right/Left Knee Right;Left    Right Knee Extension 134    Right Knee Flexion -4   hyperextension   Left Knee Extension 130    Left Knee Flexion -4   hyperextension     Strength   Strength Assessment Site Hip;Knee    Right/Left Hip Right;Left    Right Hip Flexion 5/5    Right Hip ABduction 5/5    Right Hip ADduction 5/5    Left Hip Flexion 4/5    Left Hip ABduction 4+/5    Left Hip ADduction 4/5    Right/Left Knee Right;Left    Right Knee Flexion 5/5    Right Knee Extension 5/5    Left Knee Flexion 4/5    Left Knee Extension 4/5      Palpation   Patella mobility painful with inferior superior movements and lateral movement    Palpation comment TTP around L patella, infalmmation noted      Transfers   Five time sit to stand comments  14 seconds with no UE support      Ambulation/Gait   Assistive device None    Gait Pattern Step-through pattern;Decreased stance time - left      High Level Balance   High Level Balance Comments SLS: R: 12 seconds, L: 3 seconds                      Objective measurements completed on examination: See above findings.               PT Education - 07/19/19 1425    Education Details HEP, PT POC    Person(s) Educated Patient    Methods Explanation;Demonstration;Handout    Comprehension Verbalized understanding;Returned demonstration               PT Long Term Goals - 07/19/19 1547      PT LONG TERM GOAL #1   Title Pt will be independent in her HEP and progression.    Baseline initial program issued today    Time 6    Period Weeks    Status New    Target Date 08/30/19      PT LONG TERM GOAL #2  Title Pt will be able to perform SLS on  L for >/= 10 seconds in order to improve gait and functional mobility.    Baseline L SLS: 3 seconds, R SLS: 12 seconds    Time 6    Period Weeks    Status New    Target Date 08/30/19      PT LONG TERM GOAL #3   Title Pt will be able to amb for 15 minutes with pain </= 3/10 in her left hip and knee.    Baseline walking currently is painful, pt is having to wear a neoprene brace for stability.    Time 6    Period Weeks    Status New    Target Date 08/30/19                  Plan - 07/19/19 1542    Clinical Impression Statement Pt arriving to [facility] for evaluation of left chondromalacia. Pt reporting chronic hip pain and acute onset of left knee pain after slipping when she was getting into her husband's truck. Pt reporting periods of her knee buckeling and varying pain. Pt reporting she is working on losing weight and she wants to continue her exericses and weight loss journey but feels she can't walk or exercise due to L hip and knee pain. Pt presenting with mild weakness noted in L LE compared to the right. L knee flexion 130 degrees and L knee extension with 4 degrees of hyperextension. Skilled PT needed to address pt's impairments. Pt stating she wants to avoid an injection if possible and get back to her PLOF and exercising to be more healthy.    Personal Factors and Comorbidities Comorbidity 1    Comorbidities HTN, obesity    Examination-Activity Limitations Stairs;Squat;Stand    Examination-Participation Restrictions Community Activity;Other    Stability/Clinical Decision Making Stable/Uncomplicated    Clinical Decision Making Low    Rehab Potential Excellent    PT Frequency 2x / week    PT Duration 6 weeks    PT Treatment/Interventions Cryotherapy;Electrical Stimulation;Iontophoresis 4mg /ml Dexamethasone;Moist Heat;Ultrasound;Gait training;Stair training;Functional mobility training;Neuromuscular re-education;Balance training;Therapeutic exercise;Therapeutic  activities;Patient/family education;Manual techniques;Passive range of motion;Dry needling;Taping    PT Next Visit Plan bike, L LE strengthening, standing exericses, quad control with eccentric activities    PT Home Exercise Plan see pt instructions    Consulted and Agree with Plan of Care Patient           Patient will benefit from skilled therapeutic intervention in order to improve the following deficits and impairments:  Pain, Postural dysfunction, Decreased strength, Impaired flexibility, Obesity, Difficulty walking  Visit Diagnosis: Acute pain of left knee  Pain in left hip  Difficulty in walking, not elsewhere classified     Problem List Patient Active Problem List   Diagnosis Date Noted  . Chondromalacia of patellofemoral joint, left 07/12/2019  . Hyperlipidemia with target LDL less than 100 10/26/2013  . Essential hypertension, benign 07/03/2013  . Migraines 10/06/2012    12/06/2012, PT, MPT 07/19/2019, 3:56 PM  [facility] [address] [place], [place], [place] Phone: [phone]   Fax:  [phone]  Name: Denise Berger MRN: Charleston Ropes Date of Birth: 25-Aug-1980

## 2019-07-19 NOTE — Patient Instructions (Signed)
Access Code: [email or web address] URL: [email or web address] Date: 07/19/2019 Prepared by: Narda Amber  Exercises Supine Quad Set - 3 x daily - 7 x weekly - 3 sets - 10 reps - 5 seconds hold Supine Active Straight Leg Raise - 3 x daily - 7 x weekly - 3 sets - 10 reps - 3 seconds hold Clamshell - 3 x daily - 7 x weekly - 3 sets - 10 reps - 3 seconds hold Prone Hip Extension - 3 x daily - 7 x weekly - 3 sets - 10 reps - 3 seconds hold

## 2019-08-08 ENCOUNTER — Other Ambulatory Visit: Payer: Self-pay

## 2019-08-08 ENCOUNTER — Encounter: Payer: Self-pay | Admitting: Physical Therapy

## 2019-08-08 ENCOUNTER — Ambulatory Visit: Payer: Medicaid Other | Attending: Sports Medicine | Admitting: Physical Therapy

## 2019-08-08 DIAGNOSIS — M25552 Pain in left hip: Secondary | ICD-10-CM | POA: Diagnosis present

## 2019-08-08 DIAGNOSIS — R262 Difficulty in walking, not elsewhere classified: Secondary | ICD-10-CM | POA: Diagnosis present

## 2019-08-08 DIAGNOSIS — M25562 Pain in left knee: Secondary | ICD-10-CM | POA: Diagnosis not present

## 2019-08-08 NOTE — Therapy (Signed)
[facility] [address] [place], [place], [address] Phone: [phone]   Fax:  [phone]  Physical Therapy Treatment  Patient Details  Name: Denise Berger MRN: [id number] Date of Birth: 12-01-1980 Referring Provider (PT): Darlin Priestly, MD   Encounter Date: 08/08/2019   PT End of Session - 08/08/19 1519    Visit Number 2    Number of Visits 12    Date for PT Re-Evaluation 08/30/19    Authorization Type MCD    PT Start Time 1517    PT Stop Time 1600    PT Time Calculation (min) 43 min    Activity Tolerance Patient tolerated treatment well    Behavior During Therapy [facility] for tasks assessed/performed           Past Medical History:  Diagnosis Date  . Hypertension    no longer due to weight loss has improved BP    Past Surgical History:  Procedure Laterality Date  . APPENDECTOMY  age 14  . 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 ETHMOIDECTOMY Left 05/05/2019   Procedure: LEFT ETHMOIDECTOMY AND FRONTAL RECESS;  Surgeon: Newman Pies, MD;  Location: [facility];  Service: ENT;  Laterality: Left;  . HERNIA REPAIR Right age 85   RLQ incisional hernia repair with mesh placement  . laproscopic abd surgery     to remove scar tissue  . MAXILLARY ANTROSTOMY Left 05/05/2019   Procedure: LEFT ENDOSCOPIC MAXILLARY ANTROSTOMY WITH TISSUE REMOVAL  ;  Surgeon: Newman Pies, MD;  Location: [facility];  Service: ENT;  Laterality: Left;  . SINUS ENDO WITH FUSION Left 05/05/2019   Procedure: SINUS ENDO WITH FUSION;  Surgeon: Newman Pies, MD;  Location: [facility];  Service: ENT;  Laterality: Left;    There were no vitals filed for this visit.   Subjective Assessment - 08/08/19 1516    Subjective COVID 19 screening performed on patient upon arrival. Reports she has seen a  big improvment since beginning HEP. Patient states that sometimes at night she hasn't had to use a pillow for comfort. Patient states that she has been able to walk and get in the pool as well.    Pertinent History HTN    Limitations Walking;Sitting    Patient Stated Goals X-ray were unremarkable    Currently in Pain? Yes    Pain Score 1     Pain Location Knee    Pain Orientation Left    Pain Descriptors / Indicators Discomfort    Pain Type Acute pain    Pain Onset More than a month ago    Pain Frequency Intermittent              [facility] PT Assessment - 08/08/19 0001      Assessment   Medical Diagnosis L chondromalacia L patella    Referring Provider (PT) Darlin Priestly, MD    Onset Date/Surgical Date 06/06/19    Hand Dominance Right    Next MD Visit 08/23/2019    Prior Therapy no      Precautions   Precautions None                         [facility] Adult PT Treatment/Exercise - 08/08/19 0001      Exercises   Exercises Knee/Hip  Knee/Hip Exercises: Aerobic   Recumbent Bike L3, seat 4 x10 min   reports more L hip pull     Knee/Hip Exercises: Machines for Strengthening   Cybex Knee Extension 10# 3x10 reps    Cybex Knee Flexion 30# 3x10 reps      Knee/Hip Exercises: Standing   Terminal Knee Extension Strengthening;Left;2 sets;10 reps;Theraband    Theraband Level (Terminal Knee Extension) Level 3 (Green)    Step Down Left;2 sets;10 reps;Hand Hold: 2;Step Height: 4"    Other Standing Knee Exercises L heel dot 4" step x20 reps      Knee/Hip Exercises: Supine   Bridges Strengthening;20 reps    Straight Leg Raises AROM;Left;20 reps    Straight Leg Raise with External Rotation AROM;Left;5 reps      Knee/Hip Exercises: Sidelying   Clams L hip clam x20 reps      Modalities   Modalities Vasopneumatic      Vasopneumatic   Number Minutes Vasopneumatic  15 minutes    Vasopnuematic Location  Knee    Vasopneumatic Pressure Medium    Vasopneumatic  Temperature  34/edema                       PT Long Term Goals - 07/19/19 1547      PT LONG TERM GOAL #1   Title Pt will be independent in her HEP and progression.    Baseline initial program issued today    Time 6    Period Weeks    Status New    Target Date 08/30/19      PT LONG TERM GOAL #2   Title Pt will be able to perform SLS on L for >/= 10 seconds in order to improve gait and functional mobility.    Baseline L SLS: 3 seconds, R SLS: 12 seconds    Time 6    Period Weeks    Status New    Target Date 08/30/19      PT LONG TERM GOAL #3   Title Pt will be able to amb for 15 minutes with pain </= 3/10 in her left hip and knee.    Baseline walking currently is painful, pt is having to wear a neoprene brace for stability.    Time 6    Period Weeks    Status New    Target Date 08/30/19                 Plan - 08/08/19 1557    Clinical Impression Statement Patient presented in clinic with reports of great improvement in hip/knee pain since beginning HEP. Patient now able to walk one mile without stopping due to pain. Patient guided through more advanced strengthening with VCs for eccentric control during therex. No reports of any increased pain. Mild LLE weakness noted through therex. Normal vasopneumatic response noted following removal of the modality.    Personal Factors and Comorbidities Comorbidity 1    Comorbidities HTN, obesity    Examination-Activity Limitations Stairs;Squat;Stand    Examination-Participation Restrictions Community Activity;Other    Stability/Clinical Decision Making Stable/Uncomplicated    Rehab Potential Excellent    PT Frequency 2x / week    PT Duration 6 weeks    PT Treatment/Interventions Cryotherapy;Electrical Stimulation;Iontophoresis 4mg /ml Dexamethasone;Moist Heat;Ultrasound;Gait training;Stair training;Functional mobility training;Neuromuscular re-education;Balance training;Therapeutic exercise;Therapeutic  activities;Patient/family education;Manual techniques;Passive range of motion;Dry needling;Taping    PT Next Visit Plan bike, L LE strengthening, standing exericses, quad control with eccentric activities    PT Home  Exercise Plan see pt instructions    Consulted and Agree with Plan of Care Patient           Patient will benefit from skilled therapeutic intervention in order to improve the following deficits and impairments:  Pain, Postural dysfunction, Decreased strength, Impaired flexibility, Obesity, Difficulty walking  Visit Diagnosis: Acute pain of left knee  Pain in left hip  Difficulty in walking, not elsewhere classified     Problem List Patient Active Problem List   Diagnosis Date Noted  . Chondromalacia of patellofemoral joint, left 07/12/2019  . Hyperlipidemia with target LDL less than 100 10/26/2013  . Essential hypertension, benign 07/03/2013  . Migraines 10/06/2012    Marvell Fuller, PTA 08/08/2019, 4:03 PM  [facility] [address] [place], [place], [address] Phone: [phone]   Fax:  [phone]  Name: Denise Berger MRN: [id number] Date of Birth: [birth year]

## 2019-08-11 ENCOUNTER — Other Ambulatory Visit: Payer: Self-pay

## 2019-08-11 ENCOUNTER — Ambulatory Visit: Payer: Medicaid Other | Admitting: *Deleted

## 2019-08-11 DIAGNOSIS — M25562 Pain in left knee: Secondary | ICD-10-CM

## 2019-08-11 DIAGNOSIS — M25552 Pain in left hip: Secondary | ICD-10-CM | POA: Diagnosis not present

## 2019-08-11 DIAGNOSIS — R262 Difficulty in walking, not elsewhere classified: Secondary | ICD-10-CM | POA: Diagnosis not present

## 2019-08-11 NOTE — Therapy (Signed)
[facility] [address] [place], [place], [address] Phone: [phone]   Fax:  [phone]  Physical Therapy Treatment  Patient Details  Name: Denise Berger MRN: [id number] Date of Birth: [birth year] Referring Provider (PT): Darlin Priestly, MD   Encounter Date: 08/11/2019   PT End of Session - 08/11/19 0833    Visit Number 3    Number of Visits 12    Date for PT Re-Evaluation 08/30/19    Authorization Type MCD    PT Start Time 0815    PT Stop Time 0905    PT Time Calculation (min) 50 min           Past Medical History:  Diagnosis Date  . Hypertension    no longer due to weight loss has improved BP    Past Surgical History:  Procedure Laterality Date  . APPENDECTOMY  age 39  . 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 ETHMOIDECTOMY Left 05/05/2019   Procedure: LEFT ETHMOIDECTOMY AND FRONTAL RECESS;  Surgeon: Newman Pies, MD;  Location: [facility];  Service: ENT;  Laterality: Left;  . HERNIA REPAIR Right age 39   RLQ incisional hernia repair with mesh placement  . laproscopic abd surgery     to remove scar tissue  . MAXILLARY ANTROSTOMY Left 05/05/2019   Procedure: LEFT ENDOSCOPIC MAXILLARY ANTROSTOMY WITH TISSUE REMOVAL  ;  Surgeon: Newman Pies, MD;  Location: [facility];  Service: ENT;  Laterality: Left;  . SINUS ENDO WITH FUSION Left 05/05/2019   Procedure: SINUS ENDO WITH FUSION;  Surgeon: Newman Pies, MD;  Location: [facility];  Service: ENT;  Laterality: Left;    There were no vitals filed for this visit.   Subjective Assessment - 08/11/19 0822    Subjective COVID 19 screening performed on patient upon arrival. Reports she has seen a big improvment since beginning HEP. Doing good after last Rx    Pertinent History HTN    Limitations  Walking;Sitting    Patient Stated Goals X-ray were unremarkable    Currently in Pain? Yes    Pain Score 1     Pain Location Knee    Pain Orientation Left    Pain Descriptors / Indicators Sore    Pain Type Acute pain                             [facility] Adult PT Treatment/Exercise - 08/11/19 0001      Exercises   Exercises Knee/Hip      Knee/Hip Exercises: Aerobic   Recumbent Bike L3, seat 4 x10 min   reports more L hip pull     Knee/Hip Exercises: Machines for Strengthening   Cybex Knee Extension 10# 3x15-20 reps    Cybex Knee Flexion 30# 3x15-20 reps      Knee/Hip Exercises: Standing   Step Down Left;2 sets;10 reps;Hand Hold: 2;Step Height: 4"    Other Standing Knee Exercises L heel dot 4" step x20 reps      Knee/Hip Exercises: Supine   Bridges Strengthening;20 reps    Straight Leg Raises AROM;Left;20 reps    Straight Leg Raise with External Rotation AROM;Left;5 reps      Knee/Hip Exercises: Sidelying   Clams L hip clam x20 reps      Modalities  Modalities Vasopneumatic      Vasopneumatic   Number Minutes Vasopneumatic  15 minutes    Vasopnuematic Location  Knee    Vasopneumatic Pressure Medium    Vasopneumatic Temperature  34/edema, pain                       PT Long Term Goals - 07/19/19 1547      PT LONG TERM GOAL #1   Title Pt will be independent in her HEP and progression.    Baseline initial program issued today    Time 6    Period Weeks    Status New    Target Date 08/30/19      PT LONG TERM GOAL #2   Title Pt will be able to perform SLS on L for >/= 10 seconds in order to improve gait and functional mobility.    Baseline L SLS: 3 seconds, R SLS: 12 seconds    Time 6    Period Weeks    Status New    Target Date 08/30/19      PT LONG TERM GOAL #3   Title Pt will be able to amb for 15 minutes with pain </= 3/10 in her left hip and knee.    Baseline walking currently is painful, pt is having to wear a neoprene brace  for stability.    Time 6    Period Weeks    Status New    Target Date 08/30/19                 Plan - 08/11/19 0835    Clinical Impression Statement Pt arrived today doing fairly well with low pain levels. She was guided through [facility] and [facility] strengthening and did great with mainly some soreness LT knee. Normal Vaso response today    Personal Factors and Comorbidities Comorbidity 1    Comorbidities HTN, obesity    Examination-Activity Limitations Stairs;Squat;Stand    Examination-Participation Restrictions Community Activity;Other    Stability/Clinical Decision Making Stable/Uncomplicated    Rehab Potential Excellent    PT Frequency 2x / week    PT Duration 6 weeks    PT Treatment/Interventions Cryotherapy;Electrical Stimulation;Iontophoresis 4mg /ml Dexamethasone;Moist Heat;Ultrasound;Gait training;Stair training;Functional mobility training;Neuromuscular re-education;Balance training;Therapeutic exercise;Therapeutic activities;Patient/family education;Manual techniques;Passive range of motion;Dry needling;Taping    PT Next Visit Plan bike, L LE strengthening, standing exericses, quad control with eccentric activities    Consulted and Agree with Plan of Care Patient           Patient will benefit from skilled therapeutic intervention in order to improve the following deficits and impairments:  Pain, Postural dysfunction, Decreased strength, Impaired flexibility, Obesity, Difficulty walking  Visit Diagnosis: Acute pain of left knee  Pain in left hip  Difficulty in walking, not elsewhere classified     Problem List Patient Active Problem List   Diagnosis Date Noted  . Chondromalacia of patellofemoral joint, left 07/12/2019  . Hyperlipidemia with target LDL less than 100 10/26/2013  . Essential hypertension, benign 07/03/2013  . Migraines 10/06/2012    Denise Berger,CHRIS, PTA 08/11/2019, 10:50 AM  [facility] [address] [place], [place], [place] Phone: [phone]   Fax:  [phone]  Name: Denise Berger MRN: Charleston Ropes Date of Birth: December 04, 1980

## 2019-08-15 ENCOUNTER — Other Ambulatory Visit: Payer: Self-pay

## 2019-08-15 ENCOUNTER — Ambulatory Visit: Payer: Medicaid Other | Admitting: Physical Therapy

## 2019-08-15 ENCOUNTER — Encounter: Payer: Self-pay | Admitting: Physical Therapy

## 2019-08-15 DIAGNOSIS — R262 Difficulty in walking, not elsewhere classified: Secondary | ICD-10-CM

## 2019-08-15 DIAGNOSIS — M25562 Pain in left knee: Secondary | ICD-10-CM | POA: Diagnosis not present

## 2019-08-15 DIAGNOSIS — M25552 Pain in left hip: Secondary | ICD-10-CM | POA: Diagnosis not present

## 2019-08-15 NOTE — Therapy (Signed)
[facility] [address] [place], [place], [address] Phone: [phone]   Fax:  [phone]  Physical Therapy Treatment  Patient Details  Name: Denise Berger MRN: [id number] Date of Birth: 1980/03/17 Referring Provider (PT): Darlin Priestly, MD   Encounter Date: 08/15/2019   PT End of Session - 08/15/19 1527    Visit Number 4    Number of Visits 12    Date for PT Re-Evaluation 08/30/19    Authorization Type MCD    PT Start Time 1518    PT Stop Time 1600    PT Time Calculation (min) 42 min    Activity Tolerance Patient tolerated treatment well    Behavior During Therapy [facility] for tasks assessed/performed           Past Medical History:  Diagnosis Date  . Hypertension    no longer due to weight loss has improved BP    Past Surgical History:  Procedure Laterality Date  . APPENDECTOMY  age 39  . 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 ETHMOIDECTOMY Left 05/05/2019   Procedure: LEFT ETHMOIDECTOMY AND FRONTAL RECESS;  Surgeon: Newman Pies, MD;  Location: [facility];  Service: ENT;  Laterality: Left;  . HERNIA REPAIR Right age 39   RLQ incisional hernia repair with mesh placement  . laproscopic abd surgery     to remove scar tissue  . MAXILLARY ANTROSTOMY Left 05/05/2019   Procedure: LEFT ENDOSCOPIC MAXILLARY ANTROSTOMY WITH TISSUE REMOVAL  ;  Surgeon: Newman Pies, MD;  Location: [facility];  Service: ENT;  Laterality: Left;  . SINUS ENDO WITH FUSION Left 05/05/2019   Procedure: SINUS ENDO WITH FUSION;  Surgeon: Newman Pies, MD;  Location: [facility];  Service: ENT;  Laterality: Left;    There were no vitals filed for this visit.   Subjective Assessment - 08/15/19 1525    Subjective COVID 19 screening performed on patient upon arrival. Reports she had no  pain after last treatment but states that she went to a baby shower and tried to ride on a kids bike. Has felt discomfort in L hip/ groin since.    Pertinent History HTN    Limitations Walking;Sitting    Patient Stated Goals X-ray were unremarkable    Currently in Pain? Yes    Pain Score 2     Pain Location Hip    Pain Orientation Left    Pain Descriptors / Indicators Discomfort;Sore    Pain Type Acute pain    Pain Onset More than a month ago    Pain Frequency Intermittent              [facility] PT Assessment - 08/15/19 0001      Assessment   Medical Diagnosis L chondromalacia L patella    Referring Provider (PT) Darlin Priestly, MD    Onset Date/Surgical Date 06/06/19    Hand Dominance Right    Next MD Visit 08/23/2019    Prior Therapy no      Precautions   Precautions None                         [facility] Adult PT Treatment/Exercise - 08/15/19 0001      Knee/Hip Exercises: Stretches   Piriformis Stretch Left;3 reps;30 seconds      Knee/Hip  Exercises: Aerobic   Recumbent Bike L4, seat 3 x10 min      Knee/Hip Exercises: Machines for Strengthening   Cybex Knee Extension 10# 3x10 reps    Cybex Knee Flexion 30# 3x10 reps      Knee/Hip Exercises: Standing   Terminal Knee Extension Strengthening;Left;20 reps;Limitations    Terminal Knee Extension Limitations Blue XTS    Step Down Left;15 reps;Hand Hold: 2;Step Height: 4"    Step Down Limitations reports of L knee pain    Walking with Sports Cord B side walking blue XTS x15 reps each      Knee/Hip Exercises: Supine   Bridges Strengthening;20 reps      Knee/Hip Exercises: Sidelying   Hip ABduction Strengthening;Left;20 reps    Clams L hip clam x20 reps   reports of L groin pull     Modalities   Modalities Vasopneumatic      Vasopneumatic   Number Minutes Vasopneumatic  10 minutes    Vasopnuematic Location  Knee    Vasopneumatic Pressure Low    Vasopneumatic Temperature  34/edema, pain                        PT Long Term Goals - 08/15/19 1616      PT LONG TERM GOAL #1   Title Pt will be independent in her HEP and progression.    Baseline initial program issued today    Time 6    Period Weeks    Status Achieved      PT LONG TERM GOAL #2   Title Pt will be able to perform SLS on L for >/= 10 seconds in order to improve gait and functional mobility.    Baseline L SLS: 3 seconds, R SLS: 12 seconds    Time 6    Period Weeks    Status On-going      PT LONG TERM GOAL #3   Title Pt will be able to amb for 15 minutes with pain </= 3/10 in her left hip and knee.    Baseline walking currently is painful, pt is having to wear a neoprene brace for stability.    Time 6    Period Weeks    Status On-going                 Plan - 08/15/19 1604    Clinical Impression Statement Patient presented in clinic with reports of more L hip discomfort since getting on a bike last week. Patient reports more radicular symptoms into L groin. Patient guided through more resisted knee/hip strengthening. L knee pain reported with heel dot and more discomfort in L hip reported with SL clam. Patient does have history of sciatic nerve pain intermittantly and difficulty with lying prone. Normal vasopneumatic response noted following removal of the modality.    Personal Factors and Comorbidities Comorbidity 1    Comorbidities HTN, obesity    Examination-Activity Limitations Stairs;Squat;Stand    Examination-Participation Restrictions Community Activity;Other    Stability/Clinical Decision Making Stable/Uncomplicated    Rehab Potential Excellent    PT Frequency 2x / week    PT Duration 6 weeks    PT Treatment/Interventions Cryotherapy;Electrical Stimulation;Iontophoresis 4mg /ml Dexamethasone;Moist Heat;Ultrasound;Gait training;Stair training;Functional mobility training;Neuromuscular re-education;Balance training;Therapeutic exercise;Therapeutic activities;Patient/family education;Manual  techniques;Passive range of motion;Dry needling;Taping    PT Next Visit Plan bike, L LE strengthening, standing exericses, quad control with eccentric activities    PT Home Exercise Plan see pt instructions    Consulted and  Agree with Plan of Care Patient           Patient will benefit from skilled therapeutic intervention in order to improve the following deficits and impairments:  Pain, Postural dysfunction, Decreased strength, Impaired flexibility, Obesity, Difficulty walking  Visit Diagnosis: Acute pain of left knee  Pain in left hip  Difficulty in walking, not elsewhere classified     Problem List Patient Active Problem List   Diagnosis Date Noted  . Chondromalacia of patellofemoral joint, left 07/12/2019  . Hyperlipidemia with target LDL less than 100 10/26/2013  . Essential hypertension, benign 07/03/2013  . Migraines 10/06/2012    Marvell Fuller, PTA 08/15/2019, 4:18 PM  [facility] [address] [place], [place], [address] Phone: [phone]   Fax:  [phone]  Name: Madelyn Tlatelpa MRN: [id number] Date of Birth: 10-Oct-1980

## 2019-08-18 ENCOUNTER — Other Ambulatory Visit: Payer: Self-pay

## 2019-08-18 ENCOUNTER — Ambulatory Visit: Payer: Medicaid Other | Admitting: *Deleted

## 2019-08-18 DIAGNOSIS — R262 Difficulty in walking, not elsewhere classified: Secondary | ICD-10-CM | POA: Diagnosis not present

## 2019-08-18 DIAGNOSIS — M25552 Pain in left hip: Secondary | ICD-10-CM | POA: Diagnosis not present

## 2019-08-18 DIAGNOSIS — M25562 Pain in left knee: Secondary | ICD-10-CM

## 2019-08-18 NOTE — Therapy (Signed)
[facility] [address] [place], [place], [address] Phone: [phone]   Fax:  [phone]  Physical Therapy Treatment  Patient Details  Name: Denise Berger MRN: [id number] Date of Birth: 1980/09/29 Referring Provider (PT): Darlin Priestly, MD   Encounter Date: 08/18/2019   PT End of Session - 08/18/19 0833    Visit Number 5    Number of Visits 12    Date for PT Re-Evaluation 08/30/19    Authorization Type MCD    PT Start Time 0815    PT Stop Time 0905    PT Time Calculation (min) 50 min           Past Medical History:  Diagnosis Date  . Hypertension    no longer due to weight loss has improved BP    Past Surgical History:  Procedure Laterality Date  . APPENDECTOMY  age 3  . 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 ETHMOIDECTOMY Left 05/05/2019   Procedure: LEFT ETHMOIDECTOMY AND FRONTAL RECESS;  Surgeon: Newman Pies, MD;  Location: [facility];  Service: ENT;  Laterality: Left;  . HERNIA REPAIR Right age 30   RLQ incisional hernia repair with mesh placement  . laproscopic abd surgery     to remove scar tissue  . MAXILLARY ANTROSTOMY Left 05/05/2019   Procedure: LEFT ENDOSCOPIC MAXILLARY ANTROSTOMY WITH TISSUE REMOVAL  ;  Surgeon: Newman Pies, MD;  Location: [facility];  Service: ENT;  Laterality: Left;  . SINUS ENDO WITH FUSION Left 05/05/2019   Procedure: SINUS ENDO WITH FUSION;  Surgeon: Newman Pies, MD;  Location: [facility];  Service: ENT;  Laterality: Left;    There were no vitals filed for this visit.   Subjective Assessment - 08/18/19 0828    Subjective COVID 19 screening performed on patient upon arrival. Doing better with minimal hip pain and knee pain today    Limitations Walking;Sitting    Currently in Pain? Yes    Pain Score 1      Pain Location Hip    Pain Orientation Left    Pain Descriptors / Indicators Discomfort;Sore    Pain Onset More than a month ago                             [facility] Adult PT Treatment/Exercise - 08/18/19 0001      Exercises   Exercises Knee/Hip      Knee/Hip Exercises: Aerobic   Recumbent Bike L5, seat 3 x10 min      Knee/Hip Exercises: Machines for Strengthening   Cybex Knee Extension 10# 3x15 reps    Cybex Knee Flexion 30# 3x15 reps      Knee/Hip Exercises: Standing   Heel Raises Both;2 sets;20 reps   2xfatigue, toe raises 2xfatigue   Forward Lunges Both;1 set;15 reps   use quad to control movement. 14inch step   Step Down Left;Hand Hold: 2;Step Height: 4";10 reps;3 sets    Step Down Limitations no pain today      Modalities   Modalities Vasopneumatic      Vasopneumatic   Number Minutes Vasopneumatic  15 minutes    Vasopnuematic Location  Knee    Vasopneumatic Pressure Low    Vasopneumatic Temperature  34/edema, pain  PT Long Term Goals - 08/15/19 1616      PT LONG TERM GOAL #1   Title Pt will be independent in her HEP and progression.    Baseline initial program issued today    Time 6    Period Weeks    Status Achieved      PT LONG TERM GOAL #2   Title Pt will be able to perform SLS on L for >/= 10 seconds in order to improve gait and functional mobility.    Baseline L SLS: 3 seconds, R SLS: 12 seconds    Time 6    Period Weeks    Status On-going      PT LONG TERM GOAL #3   Title Pt will be able to amb for 15 minutes with pain </= 3/10 in her left hip and knee.    Baseline walking currently is painful, pt is having to wear a neoprene brace for stability.    Time 6    Period Weeks    Status On-going                 Plan - 08/18/19 6720    Clinical Impression Statement Pt arrived today doing fairly well with mainly soreness and no pain LT knee. She was guided through [facility] and [facility] for ankle as well  hip and knee strengthening with mainly fatigue end of session.    Personal Factors and Comorbidities Comorbidity 1    Comorbidities HTN, obesity    Examination-Activity Limitations Stairs;Squat;Stand    Examination-Participation Restrictions Community Activity;Other    Stability/Clinical Decision Making Stable/Uncomplicated    Rehab Potential Excellent    PT Frequency 2x / week    PT Duration 6 weeks    PT Treatment/Interventions Cryotherapy;Electrical Stimulation;Iontophoresis 4mg /ml Dexamethasone;Moist Heat;Ultrasound;Gait training;Stair training;Functional mobility training;Neuromuscular re-education;Balance training;Therapeutic exercise;Therapeutic activities;Patient/family education;Manual techniques;Passive range of motion;Dry needling;Taping    PT Next Visit Plan bike, L LE strengthening, standing exericses, quad control with eccentric activities    Consulted and Agree with Plan of Care Patient           Patient will benefit from skilled therapeutic intervention in order to improve the following deficits and impairments:  Pain, Postural dysfunction, Decreased strength, Impaired flexibility, Obesity, Difficulty walking  Visit Diagnosis: Acute pain of left knee  Pain in left hip  Difficulty in walking, not elsewhere classified     Problem List Patient Active Problem List   Diagnosis Date Noted  . Chondromalacia of patellofemoral joint, left 07/12/2019  . Hyperlipidemia with target LDL less than 100 10/26/2013  . Essential hypertension, benign 07/03/2013  . Migraines 10/06/2012    Tyrell Brereton,CHRIS, PTA 08/18/2019, 9:45 AM  [facility] [address] [place], [place], [place] Phone: [phone]   Fax:  [phone]  Name: Bralynn Velador MRN: Charleston Ropes Date of Birth: 1980-05-30

## 2019-08-22 ENCOUNTER — Other Ambulatory Visit: Payer: Self-pay

## 2019-08-22 ENCOUNTER — Ambulatory Visit: Payer: Medicaid Other | Admitting: *Deleted

## 2019-08-22 DIAGNOSIS — R262 Difficulty in walking, not elsewhere classified: Secondary | ICD-10-CM | POA: Diagnosis not present

## 2019-08-22 DIAGNOSIS — M25552 Pain in left hip: Secondary | ICD-10-CM

## 2019-08-22 DIAGNOSIS — M25562 Pain in left knee: Secondary | ICD-10-CM

## 2019-08-22 NOTE — Therapy (Signed)
[facility] [address] [place], [place], [address] Phone: [phone]   Fax:  [phone]  Physical Therapy Treatment  Patient Details  Name: Denise Berger MRN: [id number] Date of Birth: [birth year] Referring Provider (PT): Darlin Priestly, MD   Encounter Date: 08/22/2019   PT End of Session - 08/22/19 1642    Visit Number 6    Number of Visits 12    Date for PT Re-Evaluation 08/30/19    Authorization Type MCD    PT Start Time 1515    PT Stop Time 1605    PT Time Calculation (min) 50 min           Past Medical History:  Diagnosis Date  . Hypertension    no longer due to weight loss has improved BP    Past Surgical History:  Procedure Laterality Date  . APPENDECTOMY  age 54  . 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 ETHMOIDECTOMY Left 05/05/2019   Procedure: LEFT ETHMOIDECTOMY AND FRONTAL RECESS;  Surgeon: Newman Pies, MD;  Location: [facility];  Service: ENT;  Laterality: Left;  . HERNIA REPAIR Right age 43   RLQ incisional hernia repair with mesh placement  . laproscopic abd surgery     to remove scar tissue  . MAXILLARY ANTROSTOMY Left 05/05/2019   Procedure: LEFT ENDOSCOPIC MAXILLARY ANTROSTOMY WITH TISSUE REMOVAL  ;  Surgeon: Newman Pies, MD;  Location: [facility];  Service: ENT;  Laterality: Left;  . SINUS ENDO WITH FUSION Left 05/05/2019   Procedure: SINUS ENDO WITH FUSION;  Surgeon: Newman Pies, MD;  Location: [facility];  Service: ENT;  Laterality: Left;    There were no vitals filed for this visit.   Subjective Assessment - 08/22/19 1611    Subjective COVID 19 screening performed on patient upon arrival. Doing better with minimal hip pain and knee pain today 2/10 today    Limitations Walking;Sitting    Patient Stated Goals X-ray were  unremarkable    Currently in Pain? Yes    Pain Score 2     Pain Location Hip    Pain Orientation Left    Pain Descriptors / Indicators Discomfort    Pain Type Acute pain                             [facility] Adult PT Treatment/Exercise - 08/22/19 0001      Exercises   Exercises Knee/Hip      Knee/Hip Exercises: Aerobic   Recumbent Bike L5, seat 3 x10 min      Knee/Hip Exercises: Machines for Strengthening   Cybex Knee Extension 10# 3x15 reps    Cybex Knee Flexion 30# 3x15 reps      Knee/Hip Exercises: Standing   Heel Raises Both;2 sets;20 reps   2xfatigue, toe raises 2xfatigue   Forward Lunges Both;1 set;15 reps   use quad to control movement. 14inch step   Step Down Left;Hand Hold: 2;Step Height: 4";10 reps;3 sets    Step Down Limitations no pain today      Modalities   Modalities Vasopneumatic      Vasopneumatic   Number Minutes Vasopneumatic  15 minutes    Vasopnuematic Location  Knee    Vasopneumatic Pressure Low    Vasopneumatic Temperature  34/edema, pain  PT Long Term Goals - 08/15/19 1616      PT LONG TERM GOAL #1   Title Pt will be independent in her HEP and progression.    Baseline initial program issued today    Time 6    Period Weeks    Status Achieved      PT LONG TERM GOAL #2   Title Pt will be able to perform SLS on L for >/= 10 seconds in order to improve gait and functional mobility.    Baseline L SLS: 3 seconds, R SLS: 12 seconds    Time 6    Period Weeks    Status On-going      PT LONG TERM GOAL #3   Title Pt will be able to amb for 15 minutes with pain </= 3/10 in her left hip and knee.    Baseline walking currently is painful, pt is having to wear a neoprene brace for stability.    Time 6    Period Weeks    Status On-going                 Plan - 08/22/19 1643    Clinical Impression Statement Pt arrived today 2/10 ache in LT knee, but was able to perform all therex without  increased pain. Mainly fatigue end of Rx. Normal vaso    Personal Factors and Comorbidities Comorbidity 1    Comorbidities HTN, obesity    Examination-Activity Limitations Stairs;Squat;Stand    Examination-Participation Restrictions Community Activity;Other    Stability/Clinical Decision Making Stable/Uncomplicated    Rehab Potential Excellent    PT Frequency 2x / week    PT Duration 6 weeks    PT Treatment/Interventions Cryotherapy;Electrical Stimulation;Iontophoresis 4mg /ml Dexamethasone;Moist Heat;Ultrasound;Gait training;Stair training;Functional mobility training;Neuromuscular re-education;Balance training;Therapeutic exercise;Therapeutic activities;Patient/family education;Manual techniques;Passive range of motion;Dry needling;Taping    PT Next Visit Plan bike, L LE strengthening, standing exericses, quad control with eccentric activities    Consulted and Agree with Plan of Care Patient           Patient will benefit from skilled therapeutic intervention in order to improve the following deficits and impairments:  Pain, Postural dysfunction, Decreased strength, Impaired flexibility, Obesity, Difficulty walking  Visit Diagnosis: Acute pain of left knee  Pain in left hip  Difficulty in walking, not elsewhere classified     Problem List Patient Active Problem List   Diagnosis Date Noted  . Chondromalacia of patellofemoral joint, left 07/12/2019  . Hyperlipidemia with target LDL less than 100 10/26/2013  . Essential hypertension, benign 07/03/2013  . Migraines 10/06/2012    Shifra Swartzentruber,CHRIS, PTA 08/22/2019, 6:07 PM  [facility] [address] [place], [place], [place] Phone: [phone]   Fax:  [phone]  Name: Denise Berger MRN: Charleston Ropes Date of Birth: 08/25/1980

## 2019-08-23 ENCOUNTER — Ambulatory Visit (INDEPENDENT_AMBULATORY_CARE_PROVIDER_SITE_OTHER): Payer: Medicaid Other | Admitting: Sports Medicine

## 2019-08-23 ENCOUNTER — Encounter: Payer: Self-pay | Admitting: Sports Medicine

## 2019-08-23 DIAGNOSIS — M2242 Chondromalacia patellae, left knee: Secondary | ICD-10-CM

## 2019-08-23 MED ORDER — WEGOVY 1.7 MG/0.75ML ~~LOC~~ SOAJ: 1.7000 mg | SUBCUTANEOUS | 0 refills | Status: DC

## 2019-08-23 MED ORDER — WEGOVY 0.5 MG/0.5ML ~~LOC~~ SOAJ: 0.5000 mg | SUBCUTANEOUS | 0 refills | Status: DC

## 2019-08-23 MED ORDER — WEGOVY 1 MG/0.5ML ~~LOC~~ SOAJ: 1.0000 mg | SUBCUTANEOUS | 0 refills | Status: DC

## 2019-08-23 MED ORDER — WEGOVY 0.25 MG/0.5ML ~~LOC~~ SOAJ: 0.2500 mg | SUBCUTANEOUS | 0 refills | Status: DC

## 2019-08-23 NOTE — Assessment & Plan Note (Signed)
I also discussed 61 with Alaa, she is interested, we discussed the mechanism of action, I will go ahead and give her a prescription for the first 4 months and a discount coupon, because she has Medicaid she will likely need to obtain her medication from [facility] in [place]. I have asked her to call them first to ensure that she can get it at the discount price. She will follow this up for refills and the additional 2 months of dose titration with her PCP. I think if we could get a great deal of her weight off she would no longer need intervention for her knees or hips.

## 2019-08-23 NOTE — Patient Instructions (Signed)
[facility] is a pharmacy situated in [place], [place]. ... [address] [place], [place] [address]. Phone: [phone]

## 2019-08-23 NOTE — Progress Notes (Signed)
    Procedures performed today:    None.  Independent interpretation of notes and tests performed by another provider:   None.  Brief History, Exam, Impression, and Recommendations:    Chondromalacia of patellofemoral joint, left Denise Berger, she is a pleasant 39 year old female, she has done relatively well with physical therapy, diclofenac, most of the time her pain is nil, some days it is more significant, she is not hurting enough to do an injection, she does notice increasing pain when the weather is bad. She certainly has patellofemoral chondromalacia, before considering injection treatment or other medications for her pain we are going to help her work aggressively on weight loss, see below.  Of note she does have some pain in her left groin, worse with internal rotation all consistent with early hip osteoarthritis.  Morbid obesity (HCC) I also discussed [place] with Denise Berger, she is interested, we discussed the mechanism of action, I will go ahead and give her a prescription for the first 4 months and a discount coupon, because she has Medicaid she will likely need to obtain her medication from [facility] in [place]. I have asked her to call them first to ensure that she can get it at the discount price. She will follow this up for refills and the additional 2 months of dose titration with her PCP. I think if we could get a great deal of her weight off she would no longer need intervention for her knees or hips.    ___________________________________________ Ihor Austin. Benjamin Stain, M.D., ABFM., CAQSM. Primary Care and Sports Medicine [facility]  Adjunct Instructor of Family Medicine  [employer]

## 2019-08-23 NOTE — Assessment & Plan Note (Signed)
Denise Berger returns, she is a pleasant 39 year old female, she has done relatively well with physical therapy, diclofenac, most of the time her pain is nil, some days it is more significant, she is not hurting enough to do an injection, she does notice increasing pain when the weather is bad. She certainly has patellofemoral chondromalacia, before considering injection treatment or other medications for her pain we are going to help her work aggressively on weight loss, see below.  Of note she does have some pain in her left groin, worse with internal rotation all consistent with early hip osteoarthritis.

## 2019-08-25 ENCOUNTER — Other Ambulatory Visit: Payer: Self-pay

## 2019-08-25 ENCOUNTER — Ambulatory Visit: Payer: Medicaid Other | Admitting: Physical Therapy

## 2019-08-25 ENCOUNTER — Encounter: Payer: Self-pay | Admitting: Physical Therapy

## 2019-08-25 DIAGNOSIS — M25562 Pain in left knee: Secondary | ICD-10-CM | POA: Diagnosis not present

## 2019-08-25 DIAGNOSIS — M25552 Pain in left hip: Secondary | ICD-10-CM | POA: Diagnosis not present

## 2019-08-25 DIAGNOSIS — R262 Difficulty in walking, not elsewhere classified: Secondary | ICD-10-CM

## 2019-08-25 NOTE — Therapy (Addendum)
[facility] [place], [place], [address] Phone: [phone]   Fax:  [phone]  Physical Therapy Treatment PHYSICAL THERAPY DISCHARGE SUMMARY  Visits from Start of Care: 7  Current functional level related to goals / functional outcomes: See below   Remaining deficits: See goals   Education / Equipment: HEP  Plan: Patient agrees to discharge.  Patient goals were not met. Patient is being discharged due to not returning since the last visit.  ?????      Patient Details  Name: Denise Berger MRN: [id number] Date of Birth: 11/28/1980 Referring Provider (PT): Rosalio Loud, MD   Encounter Date: 08/25/2019   PT End of Session - 08/25/19 0746    Visit Number 7    Number of Visits 12    Date for PT Re-Evaluation 08/30/19    Authorization Type MCD    Authorization Time Period 07/26/19-09/05/19    PT Start Time 0732    PT Stop Time 0816    PT Time Calculation (min) 44 min    Activity Tolerance Patient tolerated treatment well    Behavior During Therapy [facility] for tasks assessed/performed           Past Medical History:  Diagnosis Date  . Hypertension    no longer due to weight loss has improved BP    Past Surgical History:  Procedure Laterality Date  . APPENDECTOMY  age 77  . 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 ETHMOIDECTOMY Left 05/05/2019   Procedure: LEFT ETHMOIDECTOMY AND FRONTAL RECESS;  Surgeon: Leta Baptist, MD;  Location: [facility];  Service: ENT;  Laterality: Left;  . HERNIA REPAIR Right age 85   RLQ incisional hernia repair with mesh placement  . laproscopic abd surgery     to remove scar tissue  . MAXILLARY ANTROSTOMY Left 05/05/2019   Procedure: LEFT ENDOSCOPIC MAXILLARY ANTROSTOMY WITH TISSUE REMOVAL  ;  Surgeon: Leta Baptist, MD;  Location: [facility];  Service: ENT;  Laterality: Left;  . SINUS ENDO WITH FUSION Left 05/05/2019   Procedure: SINUS ENDO WITH FUSION;  Surgeon: Leta Baptist, MD;  Location: [facility];  Service: ENT;  Laterality: Left;    There were no vitals filed for this visit.   Subjective Assessment - 08/25/19 0736    Subjective COVID 19 screening performed on patient upon arrival. Patient reports weather may be causing more 2/10.    Pertinent History HTN    Limitations Walking;Sitting    Patient Stated Goals X-ray were unremarkable    Currently in Pain? Yes    Pain Score 2     Pain Location Knee    Pain Orientation Left    Pain Descriptors / Indicators Discomfort    Pain Type Acute pain              [facility] PT Assessment - 08/25/19 0001      Assessment   Medical Diagnosis L chondromalacia L patella    Referring Provider (PT) Rosalio Loud, MD    Onset Date/Surgical Date 06/06/19    Hand Dominance Right    Next MD Visit October 2021    Prior Therapy no      Precautions   Precautions None  [facility] Adult PT Treatment/Exercise - 08/25/19 0001      Exercises   Exercises Knee/Hip      Knee/Hip Exercises: Stretches   Piriformis Stretch Left;3 reps;20 seconds    Piriformis Stretch Limitations figure 4 and modified      Knee/Hip Exercises: Aerobic   Recumbent Bike L5, seat 3 x10 min      Knee/Hip Exercises: Machines for Strengthening   Cybex Knee Extension 10# 3x15 reps    Cybex Knee Flexion 30# 3x15 reps      Knee/Hip Exercises: Standing   Step Down Left;Hand Hold: 2;Step Height: 4";10 reps;3 sets    Step Down Limitations no pain today    Other Standing Knee Exercises resisted forward and backward walking x10 blue XTS    Other Standing Knee Exercises lateral stepping x2 minutes Red theraband      Knee/Hip Exercises: Seated   Sit to Sand 3 sets;Other (comment);without UE support;10 reps   left staggered stance                       PT Long Term Goals - 08/15/19 1616      PT LONG TERM GOAL #1   Title Pt will be independent in her HEP and progression.    Baseline initial program issued today    Time 6    Period Weeks    Status Achieved      PT LONG TERM GOAL #2   Title Pt will be able to perform SLS on L for >/= 10 seconds in order to improve gait and functional mobility.    Baseline L SLS: 3 seconds, R SLS: 12 seconds    Time 6    Period Weeks    Status On-going      PT LONG TERM GOAL #3   Title Pt will be able to amb for 15 minutes with pain </= 3/10 in her left hip and knee.    Baseline walking currently is painful, pt is having to wear a neoprene brace for stability.    Time 6    Period Weeks    Status On-going                 Plan - 08/25/19 0810    Clinical Impression Statement Patient responded well to therapy session and was able to complete all exercises with muscle fatigue and soreness. Patient guided through new TEs to which she demonstrated good form after explanation. Patient instructed to ice at home and elevate as needed.    Personal Factors and Comorbidities Comorbidity 1    Comorbidities HTN, obesity    Examination-Activity Limitations Stairs;Squat;Stand    Examination-Participation Restrictions Community Activity;Other    Stability/Clinical Decision Making Stable/Uncomplicated    Clinical Decision Making Low    Rehab Potential Excellent    PT Frequency 2x / week    PT Duration 6 weeks    PT Treatment/Interventions Cryotherapy;Electrical Stimulation;Iontophoresis 36m/ml Dexamethasone;Moist Heat;Ultrasound;Gait training;Stair training;Functional mobility training;Neuromuscular re-education;Balance training;Therapeutic exercise;Therapeutic activities;Patient/family education;Manual techniques;Passive range of motion;Dry needling;Taping    PT Next Visit Plan bike, L LE strengthening, standing exericses, quad control with eccentric activities    Consulted and  Agree with Plan of Care Patient           Patient will benefit from skilled therapeutic intervention in order to improve the following deficits and impairments:  Pain, Postural dysfunction, Decreased strength, Impaired flexibility, Obesity, Difficulty walking  Visit Diagnosis: Acute pain of left knee  Pain in left hip  Difficulty in walking, not elsewhere classified     Problem List Patient Active Problem List   Diagnosis Date Noted  . Morbid obesity ([facility]) 08/23/2019  . Chondromalacia of patellofemoral joint, left 07/12/2019  . Hyperlipidemia with target LDL less than 100 10/26/2013  . Essential hypertension, benign 07/03/2013  . Migraines 10/06/2012    Gabriela Eves, PT, DPT 08/25/2019, 8:19 AM  [facility] [address] [place], [place], [address] Phone: [phone]   Fax:  [phone]  Name: Denise Berger MRN: [id number] Date of Birth: Aug 15, 1980

## 2019-09-29 ENCOUNTER — Encounter: Payer: Medicaid Other | Admitting: Nurse Practitioner

## 2019-10-02 NOTE — Telephone Encounter (Signed)
I checked on [place] on Cover my meds and medication was denied.   Case [id number]. - CF

## 2019-10-17 ENCOUNTER — Encounter: Payer: Self-pay | Admitting: Family Medicine

## 2019-10-17 ENCOUNTER — Ambulatory Visit (INDEPENDENT_AMBULATORY_CARE_PROVIDER_SITE_OTHER): Payer: Medicaid Other | Admitting: Family Medicine

## 2019-10-17 DIAGNOSIS — J209 Acute bronchitis, unspecified: Secondary | ICD-10-CM

## 2019-10-17 MED ORDER — BENZONATATE 100 MG PO CAPS: 100.0000 mg | ORAL_CAPSULE | Freq: Three times a day (TID) | ORAL | 0 refills | Status: DC | PRN

## 2019-10-17 MED ORDER — METHYLPREDNISOLONE 4 MG PO TBPK: ORAL_TABLET | ORAL | 0 refills | Status: DC

## 2019-10-17 NOTE — Progress Notes (Signed)
Virtual Visit via Telephone Note  I connected with Denise Berger on 10/17/19 at 10:38 AM by telephone and verified that I am speaking with the correct person using two identifiers. Denise Berger is currently located at work and nobody is currently with her during this visit. The provider, Gwenlyn Fudge, FNP is located in their home at time of visit.  I discussed the limitations, risks, security and privacy concerns of performing an evaluation and management service by telephone and the availability of in person appointments. I also discussed with the patient that there may be a patient responsible charge related to this service. The patient expressed understanding and agreed to proceed.  Subjective: PCP: Bennie Pierini, FNP  Chief Complaint  Patient presents with  . URI   Patient complains of cough. Additional symptoms include chest congestion and right ear pressure. Onset of symptoms was 8 days ago, unchanged since that time. She is drinking plenty of fluids and urinating per her usual. Evaluation to date: none. Treatment to date: Robitussin.  She does not smoke. She has not been vaccinated against COVID-19.    ROS: Per HPI  Current Outpatient Medications:  .  diclofenac (VOLTAREN) 75 MG EC tablet, Take 1 tablet (75 mg total) by mouth 2 (two) times daily., Disp: 60 tablet, Rfl: 3 .  WEGOVY 0.25 MG/0.5ML SOAJ, Inject 0.25 mg into the skin once a week. Use this dose for 1 month (4 shots) and then increase to next higher dose., Disp: 2 mL, Rfl: 0 .  WEGOVY 0.5 MG/0.5ML SOAJ, Inject 0.5 mg into the skin once a week. Use this dose for 1 month (4 shots) and then increase to next higher dose., Disp: 2 mL, Rfl: 0 .  WEGOVY 1 MG/0.5ML SOAJ, Inject 1 mg into the skin once a week. Use this dose for 1 month (4 shots) and then increase to next higher dose., Disp: 2 mL, Rfl: 0 .  WEGOVY 1.7 MG/0.75ML SOAJ, Inject 1.7 mg into the skin once a week. Use this dose for 1 month (4 shots) and then increase  to next higher dose., Disp: 3 mL, Rfl: 0  Allergies  Allergen Reactions  . Penicillins Hives  . Sulfa Antibiotics Hives   Past Medical History:  Diagnosis Date  . Hypertension    no longer due to weight loss has improved BP    Observations/Objective: A&O  No respiratory distress or wheezing audible over the phone Mood, judgement, and thought processes all WNL  Assessment and Plan: 1. Acute bronchitis, unspecified organism - Patient declined COVID-19 test.  - methylPREDNISolone (MEDROL DOSEPAK) 4 MG TBPK tablet; Use as directed  Dispense: 21 each; Refill: 0 - benzonatate (TESSALON PERLES) 100 MG capsule; Take 1 capsule (100 mg total) by mouth 3 (three) times daily as needed for cough.  Dispense: 30 capsule; Refill: 0   Follow Up Instructions:  I discussed the assessment and treatment plan with the patient. The patient was provided an opportunity to ask questions and all were answered. The patient agreed with the plan and demonstrated an understanding of the instructions.   The patient was advised to call back or seek an in-person evaluation if the symptoms worsen or if the condition fails to improve as anticipated.  The above assessment and management plan was discussed with the patient. The patient verbalized understanding of and has agreed to the management plan. Patient is aware to call the clinic if symptoms persist or worsen. Patient is aware when to return to the clinic for  a follow-up visit. Patient educated on when it is appropriate to go to the emergency department.   Time call ended: 10:44 AM  I provided 8 minutes of non-face-to-face time during this encounter.  Deliah Boston, MSN, APRN, FNP-C [facility] 10/17/19

## 2019-10-20 ENCOUNTER — Ambulatory Visit (INDEPENDENT_AMBULATORY_CARE_PROVIDER_SITE_OTHER): Payer: Medicaid Other | Admitting: Sports Medicine

## 2019-10-20 ENCOUNTER — Other Ambulatory Visit: Payer: Self-pay

## 2019-10-20 ENCOUNTER — Encounter: Payer: Self-pay | Admitting: Sports Medicine

## 2019-10-20 DIAGNOSIS — M2242 Chondromalacia patellae, left knee: Secondary | ICD-10-CM

## 2019-10-20 NOTE — Assessment & Plan Note (Signed)
Patellofemoral pain, hip osteoarthritis pain, all better with 20 pound weight loss and a single session of formal and subsequent home physical therapy. Return as needed.

## 2019-10-20 NOTE — Progress Notes (Signed)
    Procedures performed today:    None.  Independent interpretation of notes and tests performed by another provider:   None.  Brief History, Exam, Impression, and Recommendations:    Morbid obesity (HCC) And I discussed aggressive weight loss before considering aggressive intervention for her orthopedic problems. She has started [facility], she was able to get this from [facility] in [place] even with her Medicaid. She has lost a good 20 pounds, and feels great. Further follow-up and refills of this with her PCP.  Chondromalacia of patellofemoral joint, left Patellofemoral pain, hip osteoarthritis pain, all better with 20 pound weight loss and a single session of formal and subsequent home physical therapy. Return as needed.    ___________________________________________ Ihor Austin. Benjamin Stain, M.D., ABFM., CAQSM. Primary Care and Sports Medicine [facility]  Adjunct Instructor of Family Medicine  [employer]

## 2019-10-20 NOTE — Assessment & Plan Note (Signed)
And I discussed aggressive weight loss before considering aggressive intervention for her orthopedic problems. She has started [facility], she was able to get this from [facility] in [place] even with her Medicaid. She has lost a good 20 pounds, and feels great. Further follow-up and refills of this with her PCP.

## 2019-11-10 ENCOUNTER — Encounter: Payer: Self-pay | Admitting: Nurse Practitioner

## 2019-11-10 ENCOUNTER — Other Ambulatory Visit: Payer: Self-pay

## 2019-11-10 ENCOUNTER — Other Ambulatory Visit: Payer: Self-pay | Admitting: Nurse Practitioner

## 2019-11-10 ENCOUNTER — Ambulatory Visit (INDEPENDENT_AMBULATORY_CARE_PROVIDER_SITE_OTHER): Payer: Medicaid Other | Admitting: Nurse Practitioner

## 2019-11-10 VITALS — BP 131/88 | HR 91 | Temp 97.5°F | Resp 20 | Ht 60.0 in | Wt 220.0 lb

## 2019-11-10 DIAGNOSIS — E785 Hyperlipidemia, unspecified: Secondary | ICD-10-CM

## 2019-11-10 DIAGNOSIS — G43809 Other migraine, not intractable, without status migrainosus: Secondary | ICD-10-CM

## 2019-11-10 DIAGNOSIS — I10 Essential (primary) hypertension: Secondary | ICD-10-CM | POA: Diagnosis not present

## 2019-11-10 MED ORDER — WEGOVY 2.4 MG/0.75ML ~~LOC~~ SOAJ: 2.4000 mg | SUBCUTANEOUS | 3 refills | Status: DC

## 2019-11-10 MED ORDER — WEGOVY 1.7 MG/0.75ML ~~LOC~~ SOAJ: 1.7000 mg | SUBCUTANEOUS | 3 refills | Status: DC

## 2019-11-10 NOTE — Progress Notes (Signed)
Subjective:    Patient ID: Denise Berger, female    DOB: March 14, 1980, 39 y.o.   MRN: [id number]   Chief Complaint:  medical management of chronic issues   * period started yesterday so will do pap at next visit HPI:  1. Essential hypertension, benign Not on medication due to weight loss. Denies chest pain, SOB, dizziness. Does have headaches sometimes but history of migraines. BP Readings from Last 3 Encounters:  11/10/19 131/88  06/21/19 137/86  05/05/19 139/74    2. Other migraine without status migrainosus, not intractable Has migraines when she is on her period. She will take ibuprofen or tylenol. If the pain is severe, she will take ibuprofen and benadryl and sleep. Sometimes she has to come get a shot to help with the pain. She has never taken any other abortive medications but would like to talk about some options.   3. Hyperlipidemia with target LDL less than 100 Does not eat fatty or fried foods much anymore. Lab Results  Component Value Date   CHOL 180 02/14/2014   HDL 34 (L) 02/14/2014   LDLCALC 94 10/26/2013   TRIG 206 (H) 02/14/2014   CHOLHDL 4.3 09/29/2012    4. Morbid obesity (HCC) Is on Wegovy which she started 3 months ago. She has lost about 10-15 pounds with that so far. No side effects but she does take a stool softener every 2 days that was recommended. Wt Readings from Last 3 Encounters:  11/10/19 220 lb (99.8 kg)  10/20/19 220 lb (99.8 kg)  06/21/19 220 lb (99.8 kg)   BMI Readings from Last 3 Encounters:  11/10/19 42.97 kg/m  10/20/19 42.97 kg/m  06/21/19 42.97 kg/m     Outpatient Encounter Medications as of 11/10/2019  Medication Sig  . WEGOVY 1 MG/0.5ML SOAJ Inject 1 mg into the skin once a week. Use this dose for 1 month (4 shots) and then increase to next higher dose.  . WEGOVY 1.7 MG/0.75ML SOAJ Inject 1.7 mg into the skin once a week. Use this dose for 1 month (4 shots) and then increase to next higher dose.  . [DISCONTINUED] benzonatate  (TESSALON PERLES) 100 MG capsule Take 1 capsule (100 mg total) by mouth 3 (three) times daily as needed for cough.  . [DISCONTINUED] methylPREDNISolone (MEDROL DOSEPAK) 4 MG TBPK tablet Use as directed   No facility-administered encounter medications on file as of 11/10/2019.    Past Surgical History:  Procedure Laterality Date  . APPENDECTOMY  age 86  . 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 ETHMOIDECTOMY Left 05/05/2019   Procedure: LEFT ETHMOIDECTOMY AND FRONTAL RECESS;  Surgeon: Newman Pies, MD;  Location: [facility];  Service: ENT;  Laterality: Left;  . HERNIA REPAIR Right age 89   RLQ incisional hernia repair with mesh placement  . laproscopic abd surgery     to remove scar tissue  . MAXILLARY ANTROSTOMY Left 05/05/2019   Procedure: LEFT ENDOSCOPIC MAXILLARY ANTROSTOMY WITH TISSUE REMOVAL  ;  Surgeon: Newman Pies, MD;  Location: [facility];  Service: ENT;  Laterality: Left;  . SINUS ENDO WITH FUSION Left 05/05/2019   Procedure: SINUS ENDO WITH FUSION;  Surgeon: Newman Pies, MD;  Location: [facility];  Service: ENT;  Laterality: Left;    Family History  Problem Relation Age of Onset  . Cancer  Mother        cervical  . Diabetes Father   . Fibroids Sister        uterine  . Healthy Sister     New complaints: Has very heavy periods that are irregular with painful cramping and a migraine for the whole 7 days. She has large clots and very heavy bleeding especially on the 2nd day of her period. That day, she will bleed through a super tampon and a pad every 1 1/2 hours. Was on oral birth control pills in the past which did not help her symptoms.  Social history: [place] and kids [place].  Controlled substance contract: N/A   Review of Systems  Constitutional: Negative.   HENT:  Negative.   Eyes: Negative.   Respiratory: Negative.   Cardiovascular: Negative.   Gastrointestinal: Negative.   Genitourinary: Positive for menstrual problem.  Musculoskeletal: Negative.   Skin: Negative.   Neurological: Positive for headaches.  Psychiatric/Behavioral: Negative.        Objective:   Physical Exam Vitals and nursing note reviewed.  Constitutional:      Appearance: Normal appearance.  HENT:     Head: Normocephalic.     Right Ear: Tympanic membrane normal.     Left Ear: Tympanic membrane normal.     Nose: Nose normal.     Mouth/Throat:     Mouth: Mucous membranes are moist.     Pharynx: Oropharynx is clear.  Eyes:     Conjunctiva/sclera: Conjunctivae normal.  Cardiovascular:     Rate and Rhythm: Normal rate and regular rhythm.     Pulses: Normal pulses.     Heart sounds: Normal heart sounds.  Pulmonary:     Effort: Pulmonary effort is normal.     Breath sounds: Normal breath sounds.  Abdominal:     General: Bowel sounds are normal.     Palpations: Abdomen is soft.  Musculoskeletal:        General: Normal range of motion.     Cervical back: Normal range of motion.  Skin:    General: Skin is warm and dry.  Neurological:     General: No focal deficit present.     Mental Status: She is alert and oriented to person, place, and time.  Psychiatric:        Mood and Affect: Mood normal.        Behavior: Behavior normal.     BP 131/88   Pulse 91   Temp (!) 97.5 F (36.4 C) (Temporal)   Resp 20   Ht 5' (1.524 m)   Wt 220 lb (99.8 kg)   SpO2 100%   BMI 42.97 kg/m       Assessment & Plan:  Denise Berger comes in today with chief complaint of Medical Management of Chronic Issues   Diagnosis and orders addressed:  1. Essential hypertension, benign Low sodium diet  2. Other migraine without status migrainosus, not intractable Avoid caffeine Start motrin day before menses  3. Hyperlipidemia with target LDL less than 100 Low fat diet  4. Morbid  obesity (HCC) Discussed diet and exercise for person with BMI >25 Will recheck weight in 3-6 months  - WEGOVY 1.7 MG/0.75ML SOAJ; Inject 1.7 mg into the skin once a week. Use this dose for 1 month (4 shots) and then increase to next higher dose.  Dispense: 3 mL; Refill: 3   Follow up plan: 3 months   [name], FNP

## 2019-11-10 NOTE — Patient Instructions (Signed)

## 2019-11-13 ENCOUNTER — Telehealth: Payer: Self-pay

## 2019-11-13 NOTE — Telephone Encounter (Signed)
Received a fax for a PA for [facility] 2.4mg .   No alternates given Would you like to start PA?   Key: [id number]  Please advise and sent to PA [facility]

## 2019-11-13 NOTE — Telephone Encounter (Signed)
Yes she is already on 17mg  and it is time to move to net dose.

## 2019-11-14 ENCOUNTER — Telehealth: Payer: Self-pay

## 2019-11-14 MED ORDER — WEGOVY 2.4 MG/0.75ML ~~LOC~~ SOAJ: 2.4000 mg | SUBCUTANEOUS | 3 refills | Status: DC

## 2019-11-14 NOTE — Telephone Encounter (Signed)
PA Case: [id number], Status: Denied. Notification: Completed

## 2019-11-14 NOTE — Telephone Encounter (Signed)
Please let julie look at this, patient has been taking it for several months. This is just a dose increase.

## 2019-11-14 NOTE — Telephone Encounter (Signed)
Denied.

## 2019-11-14 NOTE — Telephone Encounter (Signed)
PA sent to plan today

## 2019-11-14 NOTE — Telephone Encounter (Signed)
PA resubmitted with clinical data to enhance patient case Await response; If denied again--I will call  Cover my meds Zailynn Witts (Key: [id number])

## 2019-11-14 NOTE — Telephone Encounter (Signed)
Sent to correct pharm - note there is also a PA in process for this med

## 2019-11-15 NOTE — Telephone Encounter (Signed)
Wegovy 2.4.mg weekly  Called pharmacy [facility]illed [facility] for [facility] Called patient and left VM

## 2019-12-18 DIAGNOSIS — J310 Chronic rhinitis: Secondary | ICD-10-CM | POA: Diagnosis not present

## 2019-12-18 DIAGNOSIS — J320 Chronic maxillary sinusitis: Secondary | ICD-10-CM | POA: Diagnosis not present

## 2019-12-18 DIAGNOSIS — J343 Hypertrophy of nasal turbinates: Secondary | ICD-10-CM | POA: Diagnosis not present

## 2019-12-25 ENCOUNTER — Telehealth: Payer: Medicaid Other | Admitting: Physician Assistant

## 2019-12-25 DIAGNOSIS — J019 Acute sinusitis, unspecified: Secondary | ICD-10-CM

## 2019-12-25 MED ORDER — FLUTICASONE PROPIONATE 50 MCG/ACT NA SUSP
2.0000 | Freq: Every day | NASAL | 0 refills | Status: DC
Start: 2019-12-25 — End: 2020-06-19

## 2019-12-25 MED ORDER — DOXYCYCLINE HYCLATE 100 MG PO CAPS: 100.0000 mg | ORAL_CAPSULE | Freq: Two times a day (BID) | ORAL | 0 refills | Status: DC

## 2019-12-25 NOTE — Progress Notes (Signed)
We are sorry that you are not feeling well.  Here is how we plan to help!  Based on what you have shared with me it looks like you have sinusitis.  Sinusitis is inflammation and infection in the sinus cavities of the head.  Based on your presentation I believe you most likely have Acute Bacterial Sinusitis.  This is an infection caused by bacteria and is treated with antibiotics. I have prescribed Doxycycline 100mg  by mouth twice a day for 10 days. I will also prescribe Flonase.  You may use an oral decongestant such as Mucinex D or if you have glaucoma or high blood pressure use plain Mucinex. Saline nasal spray help and can safely be used as often as needed for congestion.  If you develop worsening sinus pain, fever or notice severe headache and vision changes, or if symptoms are not better after completion of antibiotic, please schedule an appointment with a health care provider.    Sinus infections are not as easily transmitted as other respiratory infection, however we still recommend that you avoid close contact with loved ones, especially the very young and elderly.  Remember to wash your hands thoroughly throughout the day as this is the number one way to prevent the spread of infection!  Home Care:  Only take medications as instructed by your medical team.  Complete the entire course of an antibiotic.  Do not take these medications with alcohol.  A steam or ultrasonic humidifier can help congestion.  You can place a towel over your head and breathe in the steam from hot water coming from a faucet.  Avoid close contacts especially the very young and the elderly.  Cover your mouth when you cough or sneeze.  Always remember to wash your hands.  Get Help Right Away If:  You develop worsening fever or sinus pain.  You develop a severe head ache or visual changes.  Your symptoms persist after you have completed your treatment plan.  Make sure you  Understand these  instructions.  Will watch your condition.  Will get help right away if you are not doing well or get worse.  Your e-visit answers were reviewed by a board certified advanced clinical practitioner to complete your personal care plan.  Depending on the condition, your plan could have included both over the counter or prescription medications.  If there is a problem please reply  once you have received a response from your provider.  Your safety is important to .  If you have drug allergies check your prescription carefully.    You can use MyChart to ask questions about today's visit, request a non-urgent call back, or ask for a work or school excuse for 24 hours related to this e-Visit. If it has been greater than 24 hours you will need to follow up with your provider, or enter a new e-Visit to address those concerns.  You will get an e-mail in the next two days asking about your experience.  I hope that your e-visit has been valuable and will speed your recovery. Thank you for using e-visits.   Greater than 5 minutes, yet less than 10 minutes of time have been spent researching, coordinating, and implementing care for this patient today

## 2020-02-13 ENCOUNTER — Other Ambulatory Visit: Payer: Self-pay

## 2020-02-13 ENCOUNTER — Encounter: Payer: Self-pay | Admitting: Nurse Practitioner

## 2020-02-13 ENCOUNTER — Ambulatory Visit: Payer: Medicaid Other | Admitting: Nurse Practitioner

## 2020-02-13 ENCOUNTER — Other Ambulatory Visit (HOSPITAL_COMMUNITY)
Admission: RE | Admit: 2020-02-13 | Discharge: 2020-02-13 | Disposition: A | Discharge status: Home / Self Care | Payer: Medicaid Other | Source: Ambulatory Visit | Attending: Nurse Practitioner | Admitting: Nurse Practitioner

## 2020-02-13 VITALS — BP 128/87 | HR 91 | Temp 97.3°F | Resp 20 | Ht 60.0 in | Wt 218.0 lb

## 2020-02-13 DIAGNOSIS — Z0000 Encounter for general adult medical examination without abnormal findings: Secondary | ICD-10-CM | POA: Insufficient documentation

## 2020-02-13 DIAGNOSIS — Z0001 Encounter for general adult medical examination with abnormal findings: Secondary | ICD-10-CM

## 2020-02-13 DIAGNOSIS — G43809 Other migraine, not intractable, without status migrainosus: Secondary | ICD-10-CM | POA: Diagnosis not present

## 2020-02-13 DIAGNOSIS — I10 Essential (primary) hypertension: Secondary | ICD-10-CM

## 2020-02-13 DIAGNOSIS — N920 Excessive and frequent menstruation with regular cycle: Secondary | ICD-10-CM

## 2020-02-13 DIAGNOSIS — E785 Hyperlipidemia, unspecified: Secondary | ICD-10-CM | POA: Diagnosis not present

## 2020-02-13 LAB — URINALYSIS, COMPLETE
Bilirubin, UA: NEGATIVE
Glucose, UA: NEGATIVE
Ketones, UA: NEGATIVE
Leukocytes,UA: NEGATIVE
Nitrite, UA: NEGATIVE
Protein,UA: NEGATIVE
RBC, UA: NEGATIVE
Specific Gravity, UA: 1.025 (ref 1.005–1.030)
Urobilinogen, Ur: 0.2 mg/dL (ref 0.2–1.0)
pH, UA: 7 (ref 5.0–7.5)

## 2020-02-13 LAB — MICROSCOPIC EXAMINATION: RBC, Urine: NONE SEEN /hpf (ref 0–2)

## 2020-02-13 MED ORDER — OZEMPIC (0.25 OR 0.5 MG/DOSE) 2 MG/1.5ML ~~LOC~~ SOPN: PEN_INJECTOR | SUBCUTANEOUS | 2 refills | Status: DC

## 2020-02-13 NOTE — Patient Instructions (Signed)
Calorie Counting for Weight Loss Calories are units of energy. Your body needs a certain number of calories from food to keep going throughout the day. When you eat or drink more calories than your body needs, your body stores the extra calories mostly as fat. When you eat or drink fewer calories than your body needs, your body burns fat to get the energy it needs. Calorie counting means keeping track of how many calories you eat and drink each day. Calorie counting can be helpful if you need to lose weight. If you eat fewer calories than your body needs, you should lose weight. Ask your health care provider what a healthy weight is for you. For calorie counting to work, you will need to eat the right number of calories each day to lose a healthy amount of weight per week. A dietitian can help you figure out how many calories you need in a day and will suggest ways to reach your calorie goal.  A healthy amount of weight to lose each week is usually 1-2 lb (0.5-0.9 kg). This usually means that your daily calorie intake should be reduced by 500-750 calories.  Eating 1,200-1,500 calories a day can help most women lose weight.  Eating 1,500-1,800 calories a day can help most men lose weight. What do I need to know about calorie counting? Work with your health care provider or dietitian to determine how many calories you should get each day. To meet your daily calorie goal, you will need to:  Find out how many calories are in each food that you would like to eat. Try to do this before you eat.  Decide how much of the food you plan to eat.  Keep a food log. Do this by writing down what you ate and how many calories it had. To successfully lose weight, it is important to balance calorie counting with a healthy lifestyle that includes regular activity. Where do I find calorie information? The number of calories in a food can be found on a Nutrition Facts label. If a food does not have a Nutrition Facts  label, try to look up the calories online or ask your dietitian for help. Remember that calories are listed per serving. If you choose to have more than one serving of a food, you will have to multiply the calories per serving by the number of servings you plan to eat. For example, the label on a package of bread might say that a serving size is 1 slice and that there are 90 calories in a serving. If you eat 1 slice, you will have eaten 90 calories. If you eat 2 slices, you will have eaten 180 calories.   How do I keep a food log? After each time that you eat, record the following in your food log as soon as possible:  What you ate. Be sure to include toppings, sauces, and other extras on the food.  How much you ate. This can be measured in cups, ounces, or number of items.  How many calories were in each food and drink.  The total number of calories in the food you ate. Keep your food log near you, such as in a pocket-sized notebook or on an app or website on your mobile phone. Some programs will calculate calories for you and show you how many calories you have left to meet your daily goal. What are some portion-control tips?  Know how many calories are in a serving. This will   help you know how many servings you can have of a certain food.  Use a measuring cup to measure serving sizes. You could also try weighing out portions on a kitchen scale. With time, you will be able to estimate serving sizes for some foods.  Take time to put servings of different foods on your favorite plates or in your favorite bowls and cups so you know what a serving looks like.  Try not to eat straight from a food's packaging, such as from a bag or box. Eating straight from the package makes it hard to see how much you are eating and can lead to overeating. Put the amount you would like to eat in a cup or on a plate to make sure you are eating the right portion.  Use smaller plates, glasses, and bowls for smaller  portions and to prevent overeating.  Try not to multitask. For example, avoid watching TV or using your computer while eating. If it is time to eat, sit down at a table and enjoy your food. This will help you recognize when you are full. It will also help you be more mindful of what and how much you are eating. What are tips for following this plan? Reading food labels  Check the calorie count compared with the serving size. The serving size may be smaller than what you are used to eating.  Check the source of the calories. Try to choose foods that are high in protein, fiber, and vitamins, and low in saturated fat, trans fat, and sodium. Shopping  Read nutrition labels while you shop. This will help you make healthy decisions about which foods to buy.  Pay attention to nutrition labels for low-fat or fat-free foods. These foods sometimes have the same number of calories or more calories than the full-fat versions. They also often have added sugar, starch, or salt to make up for flavor that was removed with the fat.  Make a grocery list of lower-calorie foods and stick to it. Cooking  Try to cook your favorite foods in a healthier way. For example, try baking instead of frying.  Use low-fat dairy products. Meal planning  Use more fruits and vegetables. One-half of your plate should be fruits and vegetables.  Include lean proteins, such as chicken, turkey, and fish. Lifestyle Each week, aim to do one of the following:  150 minutes of moderate exercise, such as walking.  75 minutes of vigorous exercise, such as running. General information  Know how many calories are in the foods you eat most often. This will help you calculate calorie counts faster.  Find a way of tracking calories that works for you. Get creative. Try different apps or programs if writing down calories does not work for you. What foods should I eat?  Eat nutritious foods. It is better to have a nutritious,  high-calorie food, such as an avocado, than a food with few nutrients, such as a bag of potato chips.  Use your calories on foods and drinks that will fill you up and will not leave you hungry soon after eating. ? Examples of foods that fill you up are nuts and nut butters, vegetables, lean proteins, and high-fiber foods such as whole grains. High-fiber foods are foods with more than 5 g of fiber per serving.  Pay attention to calories in drinks. Low-calorie drinks include water and unsweetened drinks. The items listed above may not be a complete list of foods and beverages you can eat.   Contact a dietitian for more information.   What foods should I limit? Limit foods or drinks that are not good sources of vitamins, minerals, or protein or that are high in unhealthy fats. These include:  Candy.  Other sweets.  Sodas, specialty coffee drinks, alcohol, and juice. The items listed above may not be a complete list of foods and beverages you should avoid. Contact a dietitian for more information. How do I count calories when eating out?  Pay attention to portions. Often, portions are much larger when eating out. Try these tips to keep portions smaller: ? Consider sharing a meal instead of getting your own. ? If you get your own meal, eat only half of it. Before you start eating, ask for a container and put half of your meal into it. ? When available, consider ordering smaller portions from the menu instead of full portions.  Pay attention to your food and drink choices. Knowing the way food is cooked and what is included with the meal can help you eat fewer calories. ? If calories are listed on the menu, choose the lower-calorie options. ? Choose dishes that include vegetables, fruits, whole grains, low-fat dairy products, and lean proteins. ? Choose items that are boiled, broiled, grilled, or steamed. Avoid items that are buttered, battered, fried, or served with cream sauce. Items labeled as  crispy are usually fried, unless stated otherwise. ? Choose water, low-fat milk, unsweetened iced tea, or other drinks without added sugar. If you want an alcoholic beverage, choose a lower-calorie option, such as a glass of wine or light beer. ? Ask for dressings, sauces, and syrups on the side. These are usually high in calories, so you should limit the amount you eat. ? If you want a salad, choose a garden salad and ask for grilled meats. Avoid extra toppings such as bacon, cheese, or fried items. Ask for the dressing on the side, or ask for olive oil and vinegar or lemon to use as dressing.  Estimate how many servings of a food you are given. Knowing serving sizes will help you be aware of how much food you are eating at restaurants. Where to find more information  Centers for Disease Control and Prevention: www.cdc.gov  [place] Department of Agriculture: myplate.gov Summary  Calorie counting means keeping track of how many calories you eat and drink each day. If you eat fewer calories than your body needs, you should lose weight.  A healthy amount of weight to lose per week is usually 1-2 lb (0.5-0.9 kg). This usually means reducing your daily calorie intake by 500-750 calories.  The number of calories in a food can be found on a Nutrition Facts label. If a food does not have a Nutrition Facts label, try to look up the calories online or ask your dietitian for help.  Use smaller plates, glasses, and bowls for smaller portions and to prevent overeating.  Use your calories on foods and drinks that will fill you up and not leave you hungry shortly after a meal. This information is not intended to replace advice given to you by your health care provider. Make sure you discuss any questions you have with your health care provider. Document Revised: 02/02/2019 Document Reviewed: 02/02/2019 Elsevier Patient Education  2021 Elsevier Inc.  

## 2020-02-13 NOTE — Progress Notes (Signed)
Subjective:    Patient ID: Denise Berger, female    DOB: 06-25-1980, 40 y.o.   MRN: [id number]   Chief Complaint: Annual Exam    HPI:  1. Essential hypertension, benign No c/o chest pain, sob or headache. Does not check blood pressure at home. BP Readings from Last 3 Encounters:  02/13/20 128/87  11/10/19 131/88  06/21/19 137/86     2. Hyperlipidemia with target LDL less than 100 Does try to watch diet and walks daily. Lab Results  Component Value Date   CHOL 180 02/14/2014   HDL 34 (L) 02/14/2014   LDLCALC 94 10/26/2013   TRIG 206 (H) 02/14/2014   CHOLHDL 4.3 09/29/2012     3. Other migraine without status migrainosus, not intractable Only has 1x a month when she is on her period.  4. Morbid obesity ([place]) Was on wegovy for 79monthand lost 30lbs in that 6 month time period. Sh has been off of it for a month and sh ehas gained 4 lbs back. wuld like to go back on it but insurance will not pay for it.     Outpatient Encounter Medications as of 02/13/2020  Medication Sig  . fluticasone (FLONASE) 50 MCG/ACT nasal spray Place 2 sprays into both nostrils daily.  . [DISCONTINUED] doxycycline (VIBRAMYCIN) 100 MG capsule Take 1 capsule (100 mg total) by mouth 2 (two) times daily.  . [DISCONTINUED] Semaglutide-Weight Management (WEGOVY) 2.4 MG/0.75ML SOAJ Inject 2.4 mg into the skin once a week.   No facility-administered encounter medications on file as of 02/13/2020.    Past Surgical History:  Procedure Laterality Date  . APPENDECTOMY  age 40 . 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 ETHMOIDECTOMY Left 05/05/2019   Procedure: LEFT ETHMOIDECTOMY AND FRONTAL RECESS;  Surgeon: TLeta Baptist MD;  [facility]  Service: [place]NT;  Laterality: Left;  . HERNIA REPAIR Right age 40  RLQ incisional hernia repair with mesh  placement  . laproscopic abd surgery     to remove scar tissue  . MAXILLARY ANTROSTOMY Left 05/05/2019   Procedure: LEFT ENDOSCOPIC MAXILLARY ANTROSTOMY WITH TISSUE REMOVAL  ;  Surgeon: TLeta Baptist MD;  [facility]  Service: ENT;  Laterality: Left;  . SINUS ENDO WITH FUSION Left 05/05/2019   Procedure: SINUS ENDO WITH FUSION;  Surgeon: TLeta Baptist MD;  [facility] [facility]  Service: ENT;  Laterality: Left;    Family History  Problem Relation Age of Onset  . Cancer Mother        cervical  . Diabetes Father   . Fibroids Sister        uterine  . Healthy Sister     New complaints: Has very heavy periods and would like to be referred to GYN to see what her options are.   Social history: Lives with husband and children  Controlled substance contract: n/a    Review of Systems  Constitutional: Negative for diaphoresis.  Eyes: Negative for pain.  Respiratory: Negative for shortness of breath.   Cardiovascular: Negative for chest pain, palpitations and leg swelling.  Gastrointestinal: Negative for abdominal pain.  Endocrine: Negative for polydipsia.  Skin: Negative for rash.  Neurological: Negative for dizziness, weakness and headaches.  Hematological: Does not bruise/bleed easily.  All other systems reviewed and are negative.  Objective:   Physical Exam Vitals and nursing note reviewed.  Constitutional:      General: She is not in acute distress.    Appearance: Normal appearance. She is well-developed and well-nourished.  HENT:     Head: Normocephalic.     Nose: Nose normal.     Mouth/Throat:     Mouth: Oropharynx is clear and moist.  Eyes:     Extraocular Movements: EOM normal.     Pupils: Pupils are equal, round, and reactive to light.  Neck:     Vascular: No carotid bruit or JVD.  Cardiovascular:     Rate and Rhythm: Normal rate and regular rhythm.     Pulses: Intact distal pulses.     Heart sounds: Normal heart sounds.   Pulmonary:     Effort: Pulmonary effort is normal. No respiratory distress.     Breath sounds: Normal breath sounds. No wheezing or rales.  Chest:     Chest wall: No tenderness.  Abdominal:     General: Bowel sounds are normal. There is no distension or abdominal bruit. Aorta is normal.     Palpations: Abdomen is soft. There is no hepatomegaly, splenomegaly, mass or pulsatile mass.     Tenderness: There is no abdominal tenderness.  Genitourinary:    General: Normal vulva.     Vagina: No vaginal discharge.     Rectum: Normal.     Comments: Cervix is nonparous and pink No adnexal masses or tenderness Musculoskeletal:        General: No edema. Normal range of motion.     Cervical back: Normal range of motion and neck supple.  Lymphadenopathy:     Cervical: No cervical adenopathy.  Skin:    General: Skin is warm and dry.  Neurological:     Mental Status: She is alert and oriented to person, place, and time.     Deep Tendon Reflexes: Reflexes are normal and symmetric.  Psychiatric:        Mood and Affect: Mood and affect normal.        Behavior: Behavior normal.        Thought Content: Thought content normal.        Judgment: Judgment normal.     BP 128/87   Pulse 91   Temp (!) 97.3 F (36.3 C) (Temporal)   Resp 20   Ht 5' (1.524 m)   Wt 218 lb (98.9 kg)   SpO2 98%   BMI 42.58 kg/m        Assessment & Plan:  [name] comes in today with chief complaint of Annual Exam   Diagnosis and orders addressed:  1. Annual physical exam - Urinalysis, Complete - Thyroid Panel With TSH - Cytology - PAP  2. Essential hypertension, benign Low sodium diet - CBC with Differential/Platelet - CMP14+EGFR  3. Hyperlipidemia with target LDL less than 100 Low fat diet - Lipid panel  4. Other migraine without status migrainosus, not intractable continue current treatment until see GYN  5. Morbid obesity ([place]) Start on ozempic - Semaglutide,0.25 or 0.5MG/DOS, (OZEMPIC,  0.25 OR 0.5 MG/DOSE,) 2 MG/1.5ML SOPN; 0.53m weekly for 4 weeks, then 0.537mweekly for 4 weeks, then 64m40meekly  Dispense: 3 mL; Refill: 2  6. Menorrhagia with regular cycle - Ambulatory referral to Gynecology   Labs pending Health Maintenance reviewed Diet and exercise encouraged  Follow up plan: 6 months   MarNorth BaltimoreNP

## 2020-02-14 ENCOUNTER — Telehealth: Payer: Self-pay | Admitting: *Deleted

## 2020-02-14 LAB — THYROID PANEL WITH TSH
Free Thyroxine Index: 1.7 (ref 1.2–4.9)
T3 Uptake Ratio: 22 % — ABNORMAL LOW (ref 24–39)
T4, Total: 7.6 ug/dL (ref 4.5–12.0)
TSH: 2.23 u[IU]/mL (ref 0.450–4.500)

## 2020-02-14 LAB — CMP14+EGFR
ALT: 10 IU/L (ref 0–32)
AST: 13 IU/L (ref 0–40)
Albumin/Globulin Ratio: 1.6 (ref 1.2–2.2)
Albumin: 4.5 g/dL (ref 3.8–4.8)
Alkaline Phosphatase: 60 IU/L (ref 44–121)
BUN/Creatinine Ratio: 14 (ref 9–23)
BUN: 9 mg/dL (ref 6–20)
Bilirubin Total: <0.2 mg/dL (ref 0.0–1.2)
CO2: 21 mmol/L (ref 20–29)
Calcium: 9.8 mg/dL (ref 8.7–10.2)
Chloride: 100 mmol/L (ref 96–106)
Creatinine, Ser: 0.63 mg/dL (ref 0.57–1.00)
GFR calc Af Amer: 133 mL/min/{1.73_m2} (ref >59)
GFR calc non Af Amer: 115 mL/min/{1.73_m2} (ref >59)
Globulin, Total: 2.9 g/dL (ref 1.5–4.5)
Glucose: 90 mg/dL (ref 65–99)
Potassium: 4.8 mmol/L (ref 3.5–5.2)
Sodium: 139 mmol/L (ref 134–144)
Total Protein: 7.4 g/dL (ref 6.0–8.5)

## 2020-02-14 LAB — CBC WITH DIFFERENTIAL/PLATELET
Basophils Absolute: 0.1 10*3/uL (ref 0.0–0.2)
Basos: 1 %
EOS (ABSOLUTE): 0.1 10*3/uL (ref 0.0–0.4)
Eos: 1 %
Hematocrit: 40.6 % (ref 34.0–46.6)
Hemoglobin: 13.1 g/dL (ref 11.1–15.9)
Immature Grans (Abs): 0.1 10*3/uL (ref 0.0–0.1)
Immature Granulocytes: 1 %
Lymphocytes Absolute: 3.3 10*3/uL — ABNORMAL HIGH (ref 0.7–3.1)
Lymphs: 26 %
MCH: 26.4 pg — ABNORMAL LOW (ref 26.6–33.0)
MCHC: 32.3 g/dL (ref 31.5–35.7)
MCV: 82 fL (ref 79–97)
Monocytes Absolute: 0.8 10*3/uL (ref 0.1–0.9)
Monocytes: 7 %
Neutrophils Absolute: 8.1 10*3/uL — ABNORMAL HIGH (ref 1.4–7.0)
Neutrophils: 64 %
Platelets: 420 10*3/uL (ref 150–450)
RBC: 4.96 x10E6/uL (ref 3.77–5.28)
RDW: 13.6 % (ref 11.7–15.4)
WBC: 12.5 10*3/uL — ABNORMAL HIGH (ref 3.4–10.8)

## 2020-02-14 LAB — LIPID PANEL
Chol/HDL Ratio: 4.5 ratio — ABNORMAL HIGH (ref 0.0–4.4)
Cholesterol, Total: 181 mg/dL (ref 100–199)
HDL: 40 mg/dL (ref >39)
LDL Chol Calc (NIH): 103 mg/dL — ABNORMAL HIGH (ref 0–99)
Triglycerides: 221 mg/dL — ABNORMAL HIGH (ref 0–149)
VLDL Cholesterol Cal: 38 mg/dL (ref 5–40)

## 2020-02-14 NOTE — Telephone Encounter (Signed)
PA in process/ww   Ozempic (0.25 or 0.5 MG/DOSE) 2MG /1.5ML pen-injectors   Form [facility] Electronic [facility] Form [phone] NCPDP)

## 2020-02-14 NOTE — Telephone Encounter (Signed)
Let patient know ozempic was denied.

## 2020-02-14 NOTE — Telephone Encounter (Signed)
Patient aware and verbalized understanding. Pt wants to know what else can she try?

## 2020-02-14 NOTE — Telephone Encounter (Signed)
PA denied for ozempic.

## 2020-02-15 NOTE — Telephone Encounter (Signed)
Only other thing is adipex

## 2020-02-16 MED ORDER — PHENTERMINE HCL 37.5 MG PO TABS
37.5000 mg | ORAL_TABLET | Freq: Every day | ORAL | 2 refills | Status: DC
Start: 2020-02-16 — End: 2020-06-19

## 2020-02-16 NOTE — Telephone Encounter (Signed)
Prescription sent to pharmacy.

## 2020-02-16 NOTE — Telephone Encounter (Signed)
adipex prescription sent o pharmacy

## 2020-02-16 NOTE — Telephone Encounter (Signed)
Patient would like to try adipex

## 2020-02-19 LAB — CYTOLOGY - PAP
Chlamydia: NEGATIVE
Comment: NEGATIVE
Comment: NEGATIVE
Comment: NORMAL
Diagnosis: NEGATIVE
Neisseria Gonorrhea: NEGATIVE
Trichomonas: NEGATIVE

## 2020-03-29 ENCOUNTER — Ambulatory Visit (INDEPENDENT_AMBULATORY_CARE_PROVIDER_SITE_OTHER): Payer: Medicaid Other | Admitting: Obstetrics and Gynecology

## 2020-03-29 ENCOUNTER — Other Ambulatory Visit (HOSPITAL_COMMUNITY)
Admission: RE | Admit: 2020-03-29 | Discharge: 2020-03-29 | Disposition: A | Discharge status: Home / Self Care | Payer: Medicaid Other | Source: Ambulatory Visit | Attending: Obstetrics and Gynecology | Admitting: Obstetrics and Gynecology

## 2020-03-29 ENCOUNTER — Other Ambulatory Visit: Payer: Self-pay

## 2020-03-29 ENCOUNTER — Encounter: Payer: Self-pay | Admitting: Obstetrics and Gynecology

## 2020-03-29 DIAGNOSIS — Z3202 Encounter for pregnancy test, result negative: Secondary | ICD-10-CM

## 2020-03-29 DIAGNOSIS — N920 Excessive and frequent menstruation with regular cycle: Secondary | ICD-10-CM | POA: Insufficient documentation

## 2020-03-29 LAB — POCT PREGNANCY, URINE: Preg Test, Ur: NEGATIVE

## 2020-03-29 MED ORDER — IBUPROFEN 800 MG PO TABS
800.0000 mg | ORAL_TABLET | Freq: Once | ORAL | Status: AC
  Administered 2020-03-29: 800 mg via ORAL

## 2020-03-29 NOTE — Progress Notes (Signed)
  CC: Heavy menses Subjective:    Patient ID: Denise Berger, female    DOB: 1980-08-31, 40 y.o.   MRN: [id number]  HPI 40 yo G1P1 , c/s x1.  Seen at [facility] with history of regular heavy menses.  Per pt she has always had heavy periods.  Menses last 7 days with days 1-3 the heaviest.  Pass clots on the heavy days.  On the heaviest days patient uses 18-20 pads/tampons.  Mild fatigue on menses, no transfusions or syncope.   Review of Systems  Constitutional: Negative.   HENT: Negative.   Respiratory: Negative.   Cardiovascular: Negative.   Gastrointestinal: Negative.   Genitourinary: Positive for menstrual problem.  Musculoskeletal: Negative.   Neurological: Negative.        Objective:   Physical Exam Vitals reviewed.  Constitutional:      Appearance: Normal appearance.  HENT:     Head: Normocephalic and atraumatic.  Cardiovascular:     Rate and Rhythm: Regular rhythm.     Heart sounds: Normal heart sounds.  Pulmonary:     Effort: Pulmonary effort is normal.     Breath sounds: Normal breath sounds.  Genitourinary:    Comments: SVE: cvx WNL, see separate note for endometrial biopsy Neurological:     General: No focal deficit present.     Mental Status: She is alert.  Psychiatric:        Mood and Affect: Mood normal.    Vitals:   03/29/20 0958  BP: 120/72         Assessment & Plan:   1. Menorrhagia with regular cycle Endometrial biopsy performed today. Pelvic ultrasound ordered. Discussed options for treatment for menorrhagia as long as there is no structural reason ie fibroids.  1.OCP, not an option due to current smoking over age 29 2.Lysteda 3. Progesterone  IUD 4. Uterine ablation 5. Hysterectomy as last resort (pt does not want)  Will check pelvic ultrasound and if negative for anomalies, will consider possible IUD insertion for control of menorrhagia.    Warden Fillers, MD Faculty Attending, [facility] for [facility]

## 2020-03-29 NOTE — Addendum Note (Signed)
Addended by: Maxwell Marion E on: 03/29/2020 12:10 PM   Modules accepted: Orders

## 2020-03-29 NOTE — Progress Notes (Signed)
ENDOMETRIAL BIOPSY      Denise Berger is a 40 y.o. No obstetric history on file. here for endometrial biopsy.  The indications for endometrial biopsy were reviewed.  Risks of the biopsy including cramping, bleeding, infection, uterine perforation, inadequate specimen and need for additional procedures were discussed. The patient states she understands and agrees to undergo procedure today. Consent was signed. Time out was performed.   Indications: menorrhagia, pretreatment eval Urine HCG: neg  A bivalve speculum was placed into the vagina and the cervix was easily visualized and was prepped with Betadine x2. A single-toothed tenaculum was placed on the anterior lip of the cervix to stabilize it. The 3 mm pipelle was introduced into the endometrial cavity without difficulty to a depth of 9.5 cm, and a moderate amount of tissue was obtained and sent to pathology. This was repeated for a total of 3 passes. The instruments were removed from the patient's vagina. Minimal bleeding from the cervix at the tenaculum was noted.   The patient tolerated the procedure well. Routine post-procedure instructions were given to the patient.    Will base further management on results of biopsy.  Denise Aloe, MD Faculty Attending, [facility]

## 2020-03-29 NOTE — Progress Notes (Signed)
[place] scheduled for 04/09/20 @ 1545 (pt to arrive at 1530 with a full bladder) and pt notified via MyChart.   Fleet Contras RN 03/29/20

## 2020-03-29 NOTE — Patient Instructions (Signed)
Tranexamic acid oral tablets What is this medicine? TRANEXAMIC ACID (TRAN ex AM ik AS id) slows down or stops blood clots from being broken down. This medicine is used to treat heavy monthly menstrual bleeding. This medicine may be used for other purposes; ask your health care provider or pharmacist if you have questions. COMMON BRAND NAME(S): Cyklokapron, Lysteda What should I tell my health care provider before I take this medicine? They need to know if you have any of these conditions:  bleeding in the brain  blood clotting problems  kidney disease  vision problems  an unusual allergic reaction to tranexamic acid, other medicines, foods, dyes, or preservatives  pregnant or trying to get pregnant  breast-feeding How should I use this medicine? Take this medicine by mouth with a glass of water. Follow the directions on the prescription label. Do not cut, crush, or chew this medicine. You can take it with or without food. If it upsets your stomach, take it with food. Take your medicine at regular intervals. Do not take it more often than directed. Do not stop taking except on your doctor's advice. Do not take this medicine until your period has started. Do not take it for more than 5 days in a row. Do not take this medicine when you do not have your period. Talk to your pediatrician regarding the use of this medicine in children. While this drug may be prescribed for female children as young as 12 years of age for selected conditions, precautions do apply. Overdosage: If you think you have taken too much of this medicine contact a poison control center or emergency room at once. NOTE: This medicine is only for you. Do not share this medicine with others. What if I miss a dose? If you miss a dose, take it when you remember, and then take your next dose at least 6 hours later. Do not take more than 2 tablets at a time to make up for missed doses. What may interact with this medicine? Do  not take this medicine with any of the following medications:  estrogens  birth control pills, patches, injections, rings or other devices that contain both an estrogen and a progestin This medicine may also interact with the following medications:  certain medicines used to help your blood clot  tretinoin (taken by mouth) This list may not describe all possible interactions. Give your health care provider a list of all the medicines, herbs, non-prescription drugs, or dietary supplements you use. Also tell them if you smoke, drink alcohol, or use illegal drugs. Some items may interact with your medicine. What should I watch for while using this medicine? Tell your doctor or healthcare professional if your symptoms do not start to get better or if they get worse. Tell your doctor or healthcare professional if you notice any eye problems while taking this medicine. Your doctor will refer you to an eye doctor who will examine your eyes. What side effects may I notice from receiving this medicine? Side effects that you should report to your doctor or health care professional as soon as possible:  allergic reactions like skin rash, itching or hives, swelling of the face, lips, or tongue  breathing difficulties  changes in vision  sudden or severe pain in the chest, legs, head, or groin  unusually weak or tired Side effects that usually do not require medical attention (report to your doctor or health care professional if they continue or are bothersome):  back pain    headache  muscle or joint aches  sinus and nasal problems  stomach pain  tiredness This list may not describe all possible side effects. Call your doctor for medical advice about side effects. You may report side effects to FDA at 1-800-FDA-1088. Where should I keep my medicine? Keep out of the reach of children. Store at room temperature between 15 and 30 degrees C (59 and 86 degrees F). Throw away any unused  medicine after the expiration date. NOTE: This sheet is a summary. It may not cover all possible information. If you have questions about this medicine, talk to your doctor, pharmacist, or health care provider.  2021 Elsevier/Gold Standard (2015-01-24 09:12:15) Endometrial Ablation Endometrial ablation is a procedure that destroys the thin inner layer of the lining of the uterus (endometrium). This procedure may be done:  To stop heavy menstrual periods.  To stop bleeding that is causing anemia.  To control irregular bleeding.  To treat bleeding caused by small tumors (fibroids) in the endometrium. This procedure is often done as an alternative to major surgery, such as removal of the uterus and cervix (hysterectomy). As a result of this procedure:  You may not be able to have children. However, if you have not yet gone through menopause: ? You may still have a small chance of getting pregnant. ? You will need to use a reliable method of birth control after the procedure to prevent pregnancy.  You may stop having a menstrual period, or you may have only a small amount of bleeding during your period. Menstruation may return several years after the procedure. Tell a health care provider about:  Any allergies you have.  All medicines you are taking, including vitamins, herbs, eye drops, creams, and over-the-counter medicines.  Any problems you or family members have had with the use of anesthetic medicines.  Any blood disorders you have.  Any surgeries you have had.  Any medical conditions you have.  Whether you are pregnant or may be pregnant. What are the risks? Generally, this is a safe procedure. However, problems may occur, including:  A hole (perforation) in the uterus or bowel.  Infection in the uterus, bladder, or vagina.  Bleeding.  Allergic reaction to medicines.  Damage to nearby structures or organs.  An air bubble in the lung (air embolus).  Problems with  pregnancy.  Failure of the procedure.  Decreased ability to diagnose cancer in the endometrium. Scar tissue forms after the procedure, making it more difficult to get a sample of the uterine lining. What happens before the procedure? Medicines Ask your health care provider about:  Changing or stopping your regular medicines. This is especially important if you take diabetes medicines or blood thinners.  Taking medicines such as aspirin and ibuprofen. These medicines can thin your blood. Do not take these medicines before your procedure if your doctor tells you not to take them.  Taking over-the-counter medicines, vitamins, herbs, and supplements. Tests  You will have tests of your endometrium to make sure there are no precancerous cells or cancer cells present.  You may have an ultrasound of the uterus. General instructions  Do not use any products that contain nicotine or tobacco for at least 4 weeks before the procedure. These include cigarettes, chewing tobacco, and vaping devices, such as e-cigarettes. If you need help quitting, ask your health care provider.  You may be given medicines to thin the endometrium.  Ask your health care provider what steps will be taken to help prevent  infection. These steps may include: ? Removing hair at the surgery site. ? Washing skin with a germ-killing soap. ? Taking antibiotic medicine.  Plan to have a responsible adult take you home from the hospital or clinic.  Plan to have a responsible adult care for you for the time you are told after you leave the hospital or clinic. This is important. What happens during the procedure?  You will lie on an exam table with your feet and legs supported as in a pelvic exam.  An IV will be inserted into one of your veins.  You will be given a medicine to help you relax (sedative).  A surgical tool with a light and camera (resectoscope) will be inserted into your vagina and moved into your uterus.  This allows your surgeon to see inside your uterus.  Endometrial tissue will be destroyed and removed, using one of the following methods: ? Radiofrequency. This uses an electrical current to destroy the endometrium. ? Cryotherapy. This uses extreme cold to freeze the endometrium. ? Heated fluid. This uses a heated salt and water (saline) solution to destroy the endometrium. ? Microwave. This uses high-energy microwaves to heat up the endometrium and destroy it. ? Thermal balloon. This involves inserting a catheter with a balloon tip into the uterus. The balloon tip is filled with heated fluid to destroy the endometrium. The procedure may vary among health care providers and hospitals.   What happens after the procedure?  Your blood pressure, heart rate, breathing rate, and blood oxygen level will be monitored until you leave the hospital or clinic.  You may have vaginal bleeding for 4-6 weeks after the procedure. You may also have: ? Cramps. ? A thin, watery vaginal discharge that is light pink or brown. ? A need to urinate more than usual. ? Nausea.  If you were given a sedative during the procedure, it can affect you for several hours. Do not drive or operate machinery until your health care provider says that it is safe.  Do not have sex or insert anything into your vagina until your health care provider says it is safe. Summary  Endometrial ablation is done to treat many causes of heavy menstrual bleeding. The procedure destroys the thin inner layer of the lining of the uterus (endometrium).  This procedure is often done as an alternative to major surgery, such as removal of the uterus and cervix (hysterectomy).  Plan to have a responsible adult take you home from the hospital or clinic. This information is not intended to replace advice given to you by your health care provider. Make sure you discuss any questions you have with your health care provider. Document Revised:  07/13/2019 Document Reviewed: 07/13/2019 Elsevier Patient Education  2021 [employer] Inc. Levonorgestrel intrauterine device (IUD) What is this medicine? LEVONORGESTREL IUD (LEE voe nor jes trel) is a contraceptive (birth control) device. The device is placed inside the uterus by a health care provider. It is used to prevent pregnancy. Some devices can also be used to treat heavy bleeding that occurs during your period. This medicine may be used for other purposes; ask your health care provider or pharmacist if you have questions. COMMON BRAND NAME(S): Cameron Ali What should I tell my health care provider before I take this medicine? They need to know if you have any of these conditions:  abnormal Pap smear  cancer of the breast, uterus, or cervix  diabetes  endometritis  genital or pelvic infection now or  in the past  have more than one sexual partner or your partner has more than one partner  heart disease  history of an ectopic or tubal pregnancy  immune system problems  IUD in place  liver disease or tumor  problems with blood clots or take blood-thinners  seizures  use intravenous drugs  uterus of unusual shape  vaginal bleeding that has not been explained  an unusual or allergic reaction to levonorgestrel, other hormones, silicone, or polyethylene, medicines, foods, dyes, or preservatives  pregnant or trying to get pregnant  breast-feeding How should I use this medicine? This device is placed inside the uterus by a health care professional. Talk to your pediatrician regarding the use of this medicine in children. Special care may be needed. Overdosage: If you think you have taken too much of this medicine contact a poison control center or emergency room at once. NOTE: This medicine is only for you. Do not share this medicine with others. What if I miss a dose? This does not apply. Depending on the brand of device you have inserted, the  device will need to be replaced every 3 to 7 years if you wish to continue using this type of birth control. What may interact with this medicine? Do not take this medicine with any of the following medications:  amprenavir  bosentan  fosamprenavir This medicine may also interact with the following medications:  aprepitant  armodafinil  barbiturate medicines for inducing sleep or treating seizures  bexarotene  boceprevir  griseofulvin  medicines to treat seizures like carbamazepine, ethotoin, felbamate, oxcarbazepine, phenytoin, topiramate  modafinil  pioglitazone  rifabutin  rifampin  rifapentine  some medicines to treat HIV infection like atazanavir, efavirenz, indinavir, lopinavir, nelfinavir, tipranavir, ritonavir  St. John's wort  warfarin This list may not describe all possible interactions. Give your health care provider a list of all the medicines, herbs, non-prescription drugs, or dietary supplements you use. Also tell them if you smoke, drink alcohol, or use illegal drugs. Some items may interact with your medicine. What should I watch for while using this medicine? Visit your doctor or health care professional for regular check ups. See your doctor if you or your partner has sexual contact with others, becomes HIV positive, or gets a sexual transmitted disease. This product does not protect you against HIV infection (AIDS) or other sexually transmitted diseases. You can check the placement of the IUD yourself by reaching up to the top of your vagina with clean fingers to feel the threads. Do not pull on the threads. It is a good habit to check placement after each menstrual period. Call your doctor right away if you feel more of the IUD than just the threads or if you cannot feel the threads at all. The IUD may come out by itself. You may become pregnant if the device comes out. If you notice that the IUD has come out use a backup birth control method like  condoms and call your health care provider. Using tampons will not change the position of the IUD and are okay to use during your period. This IUD can be safely scanned with magnetic resonance imaging (MRI) only under specific conditions. Before you have an MRI, tell your healthcare provider that you have an IUD in place, and which type of IUD you have in place. What side effects may I notice from receiving this medicine? Side effects that you should report to your doctor or health care professional as soon as possible:  allergic reactions like skin rash, itching or hives, swelling of the face, lips, or tongue  fever, flu-like symptoms  genital sores  high blood pressure  no menstrual period for 6 weeks during use  pain, swelling, warmth in the leg  pelvic pain or tenderness  severe or sudden headache  signs of pregnancy  stomach cramping  sudden shortness of breath  trouble with balance, talking, or walking  unusual vaginal bleeding, discharge  yellowing of the eyes or skin Side effects that usually do not require medical attention (report to your doctor or health care professional if they continue or are bothersome):  acne  breast pain  change in sex drive or performance  changes in weight  cramping, dizziness, or faintness while the device is being inserted  headache  irregular menstrual bleeding within first 3 to 6 months of use  nausea This list may not describe all possible side effects. Call your doctor for medical advice about side effects. You may report side effects to FDA at 1-800-FDA-1088. Where should I keep my medicine? This does not apply. NOTE: This sheet is a summary. It may not cover all possible information. If you have questions about this medicine, talk to your doctor, pharmacist, or health care provider.  2021 Elsevier/Gold Standard (2019-08-22 16:27:45) Intrauterine Device Information An intrauterine device (IUD) is a medical device that  is inserted into the uterus to prevent pregnancy. It is a small, T-shaped device that has one or two nylon strings hanging down from it. The strings hang out of the lower part of the uterus (cervix) to allow for future IUD removal. There are two types of IUDs:  Hormone IUD. This type of IUD is made of plastic and contains the hormone progestin (synthetic progesterone). A hormone IUD may last 3-5 years.  Copper IUD. This type of IUD has copper wire wrapped around it. A copper IUD may last up to 10 years. How is an IUD inserted? An IUD is inserted through the vagina, through the cervix, and into the uterus with a minor medical procedure. The procedure for IUD insertion may vary among health care providers and hospitals. How does an IUD work? Synthetic progesterone in a hormonal IUD prevents pregnancy by:  Thickening cervical mucus to prevent sperm from entering the uterus.  Thinning the uterine lining to prevent a fertilized egg from being implanted there. Copper in a copper IUD prevents pregnancy by making the uterus and fallopian tubes produce a fluid that kills sperm. What are the advantages of an IUD? Advantages of either type of IUD An IUD:  Is highly effective in preventing pregnancy.  Is reversible. You can become pregnant shortly after the IUD is removed.  Is low-maintenance and can stay in place for a long time.  Has no estrogen-related side effects.  Can be used when breastfeeding.  Is not associated with weight gain.  Can be inserted right after childbirth, an abortion, or a miscarriage. Advantages of a hormone IUD  If it is inserted within 7 days of your period starting, it works right after it has been inserted. If the hormone IUD is inserted at any other time in your cycle, you will need to use a backup method of birth control for 7 days after insertion.  It can make menstrual periods lighter or stop completely.  It can reduce menstrual cramping and other discomforts  from menstrual periods.  It can be used for 3-5 years, depending on which IUD you have. Advantages of a copper IUD  It works right after it is inserted.  It can be used as a form of emergency birth control if it is inserted within 5 days after having unprotected sex.  It does not interfere with your body's natural hormones.  It can be used for up to 10 years. What are the disadvantages of an IUD?  An IUD may cause irregular menstrual bleeding for a period of time after insertion.  It is common to have pain during insertion and have cramping and vaginal bleeding after insertion.  An IUD may cut the uterus (uterine perforation) when it is inserted. This is rare.  Pelvic inflammatory disease (PID) may happen after insertion of an IUD. PID is an infection in the uterus and fallopian tubes. The IUD does not cause the infection. The infection is usually from an unknown sexually transmitted infection (STI). This is rare, and it usually happens during the first 20 days after the IUD is inserted.  A copper IUD can make your menstrual flow heavier and more painful.  IUDs cannot prevent sexually transmitted infections (STIs). How is an IUD removed?   You will lie on your back with your knees bent and your feet in footrests (stirrups).  A device will be inserted into your vagina to spread apart the vaginal walls (speculum). This will allow your health care provider to see the strings attached to the IUD.  Your health care provider will use a small instrument (forceps) to grasp the IUD strings and will pull firmly until the IUD is removed. You may have some discomfort when the IUD is removed. Your health care provider may recommend taking over-the-counter pain relievers, such as ibuprofen, before the procedure. You may also have minor spotting for a few days after the procedure. The procedure for IUD removal may vary among health care providers and hospitals. Is an IUD right for me? If you are  interested in an IUD, discuss it with your health care provider. He or she will make sure you are a good candidate for an IUD and will let you know more about the advantages, disadvantage, and possible side effects. This will allow you to make a decision about the device. Summary  An intrauterine device (IUD) is a medical device that is inserted in the uterus to prevent pregnancy. It is a small, T-shaped device that has one or two nylon strings hanging down from it.  A hormone IUD contains the hormone progestin (synthetic progesterone). A copper IUD has copper wire wrapped around it.  Synthetic progesterone in a hormone IUD prevents pregnancy by thickening cervical mucus and thinning the walls of the uterus. Copper in a copper IUD prevents pregnancy by making the uterus and fallopian tubes produce a fluid that kills sperm.  A hormone IUD can be left in place for 3-5 years. A copper IUD can be left in place for up to 10 years.  An IUD is inserted and removed by a health care provider. You may feel some pain during insertion and removal. Your health care provider may recommend taking over-the-counter pain medicine, such as ibuprofen, before an IUD procedure. This information is not intended to replace advice given to you by your health care provider. Make sure you discuss any questions you have with your health care provider. Document Revised: 07/05/2019 Document Reviewed: 07/05/2019 Elsevier Patient Education  2021 [employer].

## 2020-04-01 LAB — SURGICAL PATHOLOGY

## 2020-04-09 ENCOUNTER — Ambulatory Visit
Admission: RE | Admit: 2020-04-09 | Discharge: 2020-04-09 | Disposition: A | Discharge status: Home / Self Care | Payer: Medicaid Other | Source: Ambulatory Visit | Attending: Obstetrics and Gynecology | Admitting: Obstetrics and Gynecology

## 2020-04-09 ENCOUNTER — Other Ambulatory Visit: Payer: Self-pay

## 2020-04-09 DIAGNOSIS — D259 Leiomyoma of uterus, unspecified: Secondary | ICD-10-CM | POA: Diagnosis not present

## 2020-04-09 DIAGNOSIS — N920 Excessive and frequent menstruation with regular cycle: Secondary | ICD-10-CM | POA: Diagnosis not present

## 2020-04-09 DIAGNOSIS — N939 Abnormal uterine and vaginal bleeding, unspecified: Secondary | ICD-10-CM | POA: Diagnosis not present

## 2020-04-11 ENCOUNTER — Telehealth: Payer: Self-pay

## 2020-04-11 NOTE — Telephone Encounter (Addendum)
-----   Message from Warden Fillers, MD sent at 04/11/2020  1:20 PM EDT ----- Regarding: menorrhagia treatment Please contact pt to see if she would like to be scheduled for mirena IUD placement for treatment or a gyn follow up visit for more discussion   Called pt. Pt states she has read provider message and would like to have IUD placed. Would only like to see Donavan Foil and has limited schedule availability. Front office notified to schedule. Explained to pt that we may not be able to accommodate both her schedule and provider preference.

## 2020-04-26 ENCOUNTER — Telehealth: Payer: Self-pay | Admitting: *Deleted

## 2020-04-26 NOTE — Telephone Encounter (Signed)
Pt left VM message stating that she has not been able to get through on the phone in order to cancel her scheduled appt on 5/5 for IUD insertion. She would like to be rescheduled. I returned call to pt and advised that she will be rescheduled. She may check her Mychart account for the new appt.  She voiced understanding and requested appt w/Dr. Donavan Foil on a Thursday after 2pm.

## 2020-04-29 ENCOUNTER — Ambulatory Visit: Payer: Medicaid Other | Admitting: Nurse Practitioner

## 2020-04-29 ENCOUNTER — Encounter: Payer: Self-pay | Admitting: Nurse Practitioner

## 2020-04-29 DIAGNOSIS — J301 Allergic rhinitis due to pollen: Secondary | ICD-10-CM

## 2020-04-29 MED ORDER — PREDNISONE 20 MG PO TABS: 40.0000 mg | ORAL_TABLET | Freq: Every day | ORAL | 0 refills | Status: AC

## 2020-04-29 NOTE — Progress Notes (Signed)
   Virtual Visit  Note Due to COVID-19 pandemic this visit was conducted virtually. This visit type was conducted due to national recommendations for restrictions regarding the COVID-19 Pandemic (e.g. social distancing, sheltering in place) in an effort to limit this patient's exposure and mitigate transmission in our community. All issues noted in this document were discussed and addressed.  A physical exam was not performed with this format.  I connected with Denise Berger on 04/29/20 at 12:50 by telephone and verified that I am speaking with the correct person using two identifiers. Denise Berger is currently located at home and no one is currently with her during visit. The provider, [name], FNP is located in their office at time of visit.  I discussed the limitations, risks, security and privacy concerns of performing an evaluation and management service by telephone and the availability of in person appointments. I also discussed with the patient that there may be a patient responsible charge related to this service. The patient expressed understanding and agreed to proceed.   History and Present Illness:   Chief Complaint: URI   HPI Patient calls in c/o ears stopped up and scratchy throat. She is on xyzal daily. She has had no fever or chills. started flonase nasal spray 3days ago and has helped some.   Review of Systems  Constitutional: Negative for chills and fever.  HENT: Positive for congestion. Negative for sinus pain and sore throat.   Respiratory: Positive for cough.   Neurological: Negative for dizziness and headaches.     Observations/Objective: Alert and oriented- answers all questions appropriately No distress Voice hoarse No coough  Assessment and Plan:  Denise Berger in today with chief complaint of URI   1. Seasonal allergic rhinitis due to pollen Force fluids Rest Wear mask when outside Continue flonase and xyzal Follow up prn - predniSONE  (DELTASONE) 20 MG tablet; Take 2 tablets (40 mg total) by mouth daily with breakfast for 5 days. 2 po daily for 5 days  Dispense: 10 tablet; Refill: 0  Follow up: prn    I discussed the assessment and treatment plan with the patient. The patient was provided an opportunity to ask questions and all were answered. The patient agreed with the plan and demonstrated an understanding of the instructions.   The patient was advised to call back or seek an in-person evaluation if the symptoms worsen or if the condition fails to improve as anticipated.  The above assessment and management plan was discussed with the patient. The patient verbalized understanding of and has agreed to the management plan. Patient is aware to call the clinic if symptoms persist or worsen. Patient is aware when to return to the clinic for a follow-up visit. Patient educated on when it is appropriate to go to the emergency department.   Time call ended:  1:03  I provided 13 minutes of  non face-to-face time during this encounter.    [name], FNP

## 2020-05-03 ENCOUNTER — Other Ambulatory Visit: Payer: Self-pay | Admitting: Nurse Practitioner

## 2020-05-03 DIAGNOSIS — J0100 Acute maxillary sinusitis, unspecified: Secondary | ICD-10-CM

## 2020-05-03 MED ORDER — DOXYCYCLINE HYCLATE 100 MG PO TABS: 100.0000 mg | ORAL_TABLET | Freq: Two times a day (BID) | ORAL | 0 refills | Status: DC

## 2020-05-07 DIAGNOSIS — H5213 Myopia, bilateral: Secondary | ICD-10-CM | POA: Diagnosis not present

## 2020-05-09 ENCOUNTER — Ambulatory Visit: Payer: Medicaid Other | Admitting: Obstetrics and Gynecology

## 2020-05-20 DIAGNOSIS — H52221 Regular astigmatism, right eye: Secondary | ICD-10-CM | POA: Diagnosis not present

## 2020-05-20 DIAGNOSIS — H5212 Myopia, left eye: Secondary | ICD-10-CM | POA: Diagnosis not present

## 2020-05-23 ENCOUNTER — Encounter: Payer: Self-pay | Admitting: Obstetrics and Gynecology

## 2020-05-23 ENCOUNTER — Other Ambulatory Visit: Payer: Self-pay

## 2020-05-23 ENCOUNTER — Ambulatory Visit (INDEPENDENT_AMBULATORY_CARE_PROVIDER_SITE_OTHER): Payer: Medicaid Other | Admitting: Obstetrics and Gynecology

## 2020-05-23 VITALS — BP 142/69 | HR 84 | Wt 211.0 lb

## 2020-05-23 DIAGNOSIS — Z3202 Encounter for pregnancy test, result negative: Secondary | ICD-10-CM | POA: Diagnosis not present

## 2020-05-23 DIAGNOSIS — N920 Excessive and frequent menstruation with regular cycle: Secondary | ICD-10-CM

## 2020-05-23 DIAGNOSIS — Z30430 Encounter for insertion of intrauterine contraceptive device: Secondary | ICD-10-CM | POA: Diagnosis not present

## 2020-05-23 LAB — POCT PREGNANCY, URINE: Preg Test, Ur: NEGATIVE

## 2020-05-23 MED ORDER — LEVONORGESTREL 20.1 MCG/DAY IU IUD
1.0000 | INTRAUTERINE_SYSTEM | Freq: Once | INTRAUTERINE | Status: AC
  Administered 2020-05-23: 1 via INTRAUTERINE

## 2020-05-23 NOTE — Progress Notes (Signed)
    GYNECOLOGY OFFICE PROCEDURE NOTE  Denise Berger is a 40 y.o. G1P1. here for [place] IUD insertion. No GYN concerns.  Last pap smear was on 02/13/20 and was normal.  IUD Insertion Procedure Note Patient identified, informed consent performed, consent signed.   Discussed risks of irregular bleeding, cramping, infection, malpositioning or misplacement of the IUD outside the uterus which may require further procedure such as laparoscopy. Also discussed >99% contraception efficacy, increased risk of ectopic pregnancy with failure of method.  Time out was performed.  Urine pregnancy test negative.  Speculum placed in the vagina.  Cervix visualized.  Cleaned with Betadine x 2.  Grasped anteriorly with a single tooth tenaculum.  Uterus sounded to 10 cm.  Mirena IUD placed per manufacturer's recommendations.  Strings trimmed to 3 cm. Tenaculum was removed, good hemostasis noted.  Patient tolerated procedure well.   Patient was given post-procedure instructions.  She was advised to have backup contraception for one week.  Patient was also asked to check IUD strings periodically and follow up in 4 weeks for IUD check.   Mariel Aloe, MD, FACOG Obstetrician & Gynecologist, [facility], [facility]

## 2020-05-23 NOTE — Patient Instructions (Signed)

## 2020-05-23 NOTE — Addendum Note (Signed)
Addended byVidal Schwalbe on: 05/23/2020 03:50 PM   Modules accepted: Orders

## 2020-06-19 ENCOUNTER — Other Ambulatory Visit: Payer: Self-pay

## 2020-06-19 ENCOUNTER — Encounter: Payer: Self-pay | Admitting: Nurse Practitioner

## 2020-06-19 ENCOUNTER — Ambulatory Visit: Payer: Medicaid Other | Admitting: Nurse Practitioner

## 2020-06-19 VITALS — BP 125/77 | HR 70 | Temp 97.6°F | Resp 20 | Ht 60.0 in | Wt 211.0 lb

## 2020-06-19 DIAGNOSIS — I10 Essential (primary) hypertension: Secondary | ICD-10-CM

## 2020-06-19 DIAGNOSIS — G43809 Other migraine, not intractable, without status migrainosus: Secondary | ICD-10-CM

## 2020-06-19 DIAGNOSIS — E785 Hyperlipidemia, unspecified: Secondary | ICD-10-CM | POA: Diagnosis not present

## 2020-06-19 MED ORDER — PHENTERMINE HCL 37.5 MG PO TABS
37.5000 mg | ORAL_TABLET | Freq: Every day | ORAL | 2 refills | Status: DC
Start: 2020-06-19 — End: 2021-02-21

## 2020-06-19 NOTE — Progress Notes (Signed)
Subjective:    Patient ID: Denise Berger, female    DOB: 01/07/1980, 40 y.o.   MRN: [id number]   Chief Complaint: Medical Management of Chronic Issues    HPI:  1. Essential hypertension, benign No c/o chest pain, sob or headache. Does not check blood pressure at home. Currently not on any meds for her blood pressure BP Readings from Last 3 Encounters:  06/19/20 125/77  05/23/20 (!) 142/69  03/29/20 120/72     2. Hyperlipidemia with target LDL less than 100 Does try to watch diet. She has been exercising seeral times a week with her nephew. Lab Results  Component Value Date   CHOL 181 02/13/2020   HDL 40 02/13/2020   LDLCALC 103 (H) 02/13/2020   TRIG 221 (H) 02/13/2020   CHOLHDL 4.5 (H) 02/13/2020     3. Other migraine without status migrainosus, not intractable Has not has any since she had IUD inserted.  4. Morbid obesity ([place]) No recent weight changes. She has been taking adipex and that helps curve her appetite some. Wt Readings from Last 3 Encounters:  06/19/20 211 lb (95.7 kg)  05/23/20 211 lb (95.7 kg)  03/29/20 211 lb 8 oz (95.9 kg)   BMI Readings from Last 3 Encounters:  06/19/20 41.21 kg/m  05/23/20 41.21 kg/m  03/29/20 41.31 kg/m       Outpatient Encounter Medications as of 06/19/2020  Medication Sig   levonorgestrel (MIRENA) 20 MCG/DAY IUD 1 each by Intrauterine route once.   phentermine (ADIPEX-P) 37.5 MG tablet Take 1 tablet (37.5 mg total) by mouth daily before breakfast. (Patient not taking: Reported on 06/19/2020)   [DISCONTINUED] doxycycline (VIBRA-TABS) 100 MG tablet Take 1 tablet (100 mg total) by mouth 2 (two) times daily. 1 po bid   [DISCONTINUED] fluticasone (FLONASE) 50 MCG/ACT nasal spray Place 2 sprays into both nostrils daily.   [DISCONTINUED] Semaglutide,0.25 or 0.5MG/DOS, (OZEMPIC, 0.25 OR 0.5 MG/DOSE,) 2 MG/1.5ML SOPN 0.12m weekly for 4 weeks, then 0.5415mweekly for 4 weeks, then 15m3meekly (Patient not taking: No sig reported)    No facility-administered encounter medications on file as of 06/19/2020.    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   Procedure: OROANTRAL FISTULA REPAIR;  Surgeon: TeoLeta BaptistD;  [facility]  Laterality: Left;   ETHMOIDECTOMY Left 05/05/2019   Procedure: LEFT ETHMOIDECTOMY AND FRONTAL RECESS;  Surgeon: TeoLeta BaptistD;  [facility];  Laterality: Left;   HERNIA REPAIR Right age 82 35RLQ incisional hernia repair with mesh placement   laproscopic abd surgery     to remove scar tissue   MAXILLARY ANTROSTOMY Left 05/05/2019   Procedure: LEFT ENDOSCOPIC MAXILLARY ANTROSTOMY WITH TISSUE REMOVAL  ;  Surgeon: TeoLeta BaptistD;  [facility];  Laterality: Left;   SINUS ENDO WITH FUSION Left 05/05/2019   Procedure: SINUS ENDO WITH FUSION;  Surgeon: TeoLeta BaptistD;  [place] [facility] [place] [place];  Laterality: Left;    Family History  Problem Relation Age of Onset   Cancer Mother        cervical   Diabetes Father    Fibroids Sister        uterine   Healthy Sister     New complaints: None today  Social history: Les with husband and children  Controlled  substance contract: n/a     Review of Systems  Constitutional:  Negative for diaphoresis.  Eyes:  Negative for pain.  Respiratory:  Negative for shortness of breath.   Cardiovascular:  Negative for chest pain, palpitations and leg swelling.  Gastrointestinal:  Negative for abdominal pain.  Endocrine: Negative for polydipsia.  Skin:  Negative for rash.  Neurological:  Negative for dizziness, weakness and headaches.  Hematological:  Does not bruise/bleed easily.  All other systems reviewed and are negative.     Objective:   Physical Exam Vitals and nursing note reviewed.  Constitutional:      General: She is not in acute  distress.    Appearance: Normal appearance. She is well-developed.  HENT:     Head: Normocephalic.     Right Ear: Tympanic membrane normal.     Left Ear: Tympanic membrane normal.     Nose: Nose normal.     Mouth/Throat:     Mouth: Mucous membranes are moist.  Eyes:     Pupils: Pupils are equal, round, and reactive to light.  Neck:     Vascular: No carotid bruit or JVD.  Cardiovascular:     Rate and Rhythm: Normal rate and regular rhythm.     Heart sounds: Normal heart sounds.  Pulmonary:     Effort: Pulmonary effort is normal. No respiratory distress.     Breath sounds: Normal breath sounds. No wheezing or rales.  Chest:     Chest wall: No tenderness.  Abdominal:     General: Bowel sounds are normal. There is no distension or abdominal bruit.     Palpations: Abdomen is soft. There is no hepatomegaly, splenomegaly, mass or pulsatile mass.     Tenderness: There is no abdominal tenderness.  Musculoskeletal:        General: Normal range of motion.     Cervical back: Normal range of motion and neck supple.  Lymphadenopathy:     Cervical: No cervical adenopathy.  Skin:    General: Skin is warm and dry.  Neurological:     Mental Status: She is alert and oriented to person, place, and time.     Deep Tendon Reflexes: Reflexes are normal and symmetric.  Psychiatric:        Behavior: Behavior normal.        Thought Content: Thought content normal.        Judgment: Judgment normal.    BP 125/77   Pulse 70   Temp 97.6 F (36.4 C) (Temporal)   Resp 20   Ht 5' (1.524 m)   Wt 211 lb (95.7 kg)   SpO2 99%   BMI 41.21 kg/m        Assessment & Plan:  Denise Berger comes in today with chief complaint of Medical Management of Chronic Issues   Diagnosis and orders addressed:  1. Essential hypertension, benign Low sodium diet - CBC with Differential/Platelet - CMP14+EGFR  2. Hyperlipidemia with target LDL less than 100 Low fat diet - Lipid panel  3. Other migraine without  status migrainosus, not intractable No longer an issue- will remove dx from chart  4. Morbid obesity ([place]) Discussed diet and exercise for person with BMI >25 Will recheck weight in 3-6 months - phentermine (ADIPEX-P) 37.5 MG tablet; Take 1 tablet (37.5 mg total) by mouth daily before breakfast.  Dispense: 30 tablet; Refill: 2   Labs pending Health Maintenance reviewed Diet and exercise encouraged  Follow up plan: 6 months   Mary-Margaret Hassell Done, FNP

## 2020-06-19 NOTE — Patient Instructions (Signed)
Exercising to Lose Weight Exercise is structured, repetitive physical activity to improve fitness and health. Getting regular exercise is important for everyone. It is especially important if you are overweight. Being overweight increases your risk of heart disease, stroke, diabetes, high blood pressure, and several types of cancer.Reducing your calorie intake and exercising can help you lose weight. Exercise is usually categorized as moderate or vigorous intensity. To lose weight, most people need to do a certain amount of moderate-intensity orvigorous-intensity exercise each week. Moderate-intensity exercise  Moderate-intensity exercise is any activity that gets you moving enough to burn at least three times more energy (calories) than if you were sitting. Examples of moderate exercise include: Walking a mile in 15 minutes. Doing light yard work. Biking at an easy pace. Most people should get at least 150 minutes (2 hours and 30 minutes) a week ofmoderate-intensity exercise to maintain their body weight. Vigorous-intensity exercise Vigorous-intensity exercise is any activity that gets you moving enough to burn at least six times more calories than if you were sitting. When you exercise at this intensity, you should be working hard enough that you are not able tocarry on a conversation. Examples of vigorous exercise include: Running. Playing a team sport, such as football, basketball, and soccer. Jumping rope. Most people should get at least 75 minutes (1 hour and 15 minutes) a week ofvigorous-intensity exercise to maintain their body weight. How can exercise affect me? When you exercise enough to burn more calories than you eat, you lose weight. Exercise also reduces body fat and builds muscle. The more muscle you have, the more calories you burn. Exercise also: Improves mood. Reduces stress and tension. Improves your overall fitness, flexibility, and endurance. Increases bone strength. The  amount of exercise you need to lose weight depends on: Your age. The type of exercise. Any health conditions you have. Your overall physical ability. Talk to your health care provider about how much exercise you need and whattypes of activities are safe for you. What actions can I take to lose weight? Nutrition  Make changes to your diet as told by your health care provider or diet and nutrition specialist (dietitian). This may include: Eating fewer calories. Eating more protein. Eating less unhealthy fats. Eating a diet that includes fresh fruits and vegetables, whole grains, low-fat dairy products, and lean protein. Avoiding foods with added fat, salt, and sugar. Drink plenty of water while you exercise to prevent dehydration or heat stroke.  Activity Choose an activity that you enjoy and set realistic goals. Your health care provider can help you make an exercise plan that works for you. Exercise at a moderate or vigorous intensity most days of the week. The intensity of exercise may vary from person to person. You can tell how intense a workout is for you by paying attention to your breathing and heartbeat. Most people will notice their breathing and heartbeat get faster with more intense exercise. Do resistance training twice each week, such as: Push-ups. Sit-ups. Lifting weights. Using resistance bands. Getting short amounts of exercise can be just as helpful as long structured periods of exercise. If you have trouble finding time to exercise, try to include exercise in your daily routine. Get up, stretch, and walk around every 30 minutes throughout the day. Go for a walk during your lunch break. Park your car farther away from your destination. If you take public transportation, get off one stop early and walk the rest of the way. Make phone calls while standing up and   walking around. Take the stairs instead of elevators or escalators. Wear comfortable clothes and shoes with  good support. Do not exercise so much that you hurt yourself, feel dizzy, or get very short of breath. Where to find more information U.S. Department of Health and Human Services: www.hhs.gov Centers for Disease Control and Prevention (CDC): www.cdc.gov Contact a health care provider: Before starting a new exercise program. If you have questions or concerns about your weight. If you have a medical problem that keeps you from exercising. Get help right away if you have any of the following while exercising: Injury. Dizziness. Difficulty breathing or shortness of breath that does not go away when you stop exercising. Chest pain. Rapid heartbeat. Summary Being overweight increases your risk of heart disease, stroke, diabetes, high blood pressure, and several types of cancer. Losing weight happens when you burn more calories than you eat. Reducing the amount of calories you eat in addition to getting regular moderate or vigorous exercise each week helps you lose weight. This information is not intended to replace advice given to you by your health care provider. Make sure you discuss any questions you have with your healthcare provider. Document Revised: 04/03/2019 Document Reviewed: 04/20/2019 Elsevier Patient Education  2022 Elsevier Inc.  

## 2020-06-20 LAB — CBC WITH DIFFERENTIAL/PLATELET
Basophils Absolute: 0.1 10*3/uL (ref 0.0–0.2)
Basos: 1 %
EOS (ABSOLUTE): 0.1 10*3/uL (ref 0.0–0.4)
Eos: 1 %
Hematocrit: 41.7 % (ref 34.0–46.6)
Hemoglobin: 13 g/dL (ref 11.1–15.9)
Immature Grans (Abs): 0 10*3/uL (ref 0.0–0.1)
Immature Granulocytes: 0 %
Lymphocytes Absolute: 3.1 10*3/uL (ref 0.7–3.1)
Lymphs: 30 %
MCH: 25.9 pg — ABNORMAL LOW (ref 26.6–33.0)
MCHC: 31.2 g/dL — ABNORMAL LOW (ref 31.5–35.7)
MCV: 83 fL (ref 79–97)
Monocytes Absolute: 0.6 10*3/uL (ref 0.1–0.9)
Monocytes: 6 %
Neutrophils Absolute: 6.5 10*3/uL (ref 1.4–7.0)
Neutrophils: 62 %
Platelets: 368 10*3/uL (ref 150–450)
RBC: 5.02 x10E6/uL (ref 3.77–5.28)
RDW: 14.1 % (ref 11.7–15.4)
WBC: 10.5 10*3/uL (ref 3.4–10.8)

## 2020-06-20 LAB — CMP14+EGFR
ALT: 14 IU/L (ref 0–32)
AST: 12 IU/L (ref 0–40)
Albumin/Globulin Ratio: 2 (ref 1.2–2.2)
Albumin: 4.7 g/dL (ref 3.8–4.8)
Alkaline Phosphatase: 58 IU/L (ref 44–121)
BUN/Creatinine Ratio: 12 (ref 9–23)
BUN: 8 mg/dL (ref 6–20)
Bilirubin Total: 0.3 mg/dL (ref 0.0–1.2)
CO2: 24 mmol/L (ref 20–29)
Calcium: 9.9 mg/dL (ref 8.7–10.2)
Chloride: 101 mmol/L (ref 96–106)
Creatinine, Ser: 0.69 mg/dL (ref 0.57–1.00)
Globulin, Total: 2.4 g/dL (ref 1.5–4.5)
Glucose: 80 mg/dL (ref 65–99)
Potassium: 4.9 mmol/L (ref 3.5–5.2)
Sodium: 137 mmol/L (ref 134–144)
Total Protein: 7.1 g/dL (ref 6.0–8.5)
eGFR: 114 mL/min/{1.73_m2} (ref >59)

## 2020-06-20 LAB — LIPID PANEL
Chol/HDL Ratio: 3.8 ratio (ref 0.0–4.4)
Cholesterol, Total: 156 mg/dL (ref 100–199)
HDL: 41 mg/dL (ref >39)
LDL Chol Calc (NIH): 94 mg/dL (ref 0–99)
Triglycerides: 113 mg/dL (ref 0–149)
VLDL Cholesterol Cal: 21 mg/dL (ref 5–40)

## 2020-09-16 NOTE — Telephone Encounter (Signed)
You cannot double up on phentermine. Let me talk with clinical pharmacist about wegvey coupon. Also there ianther injectable out that is 25$ a month for 1 year that we may can try.

## 2020-11-26 IMAGING — DX DG CHEST 2V
2 series · 2 of 2 positions shown · non-contrast
Comparison: None.

CLINICAL DATA: Right shoulder and upper chest pain since falling 2
days ago. History of right clavicle fracture.

EXAM:
CHEST - 2 VIEW

[chest pa]
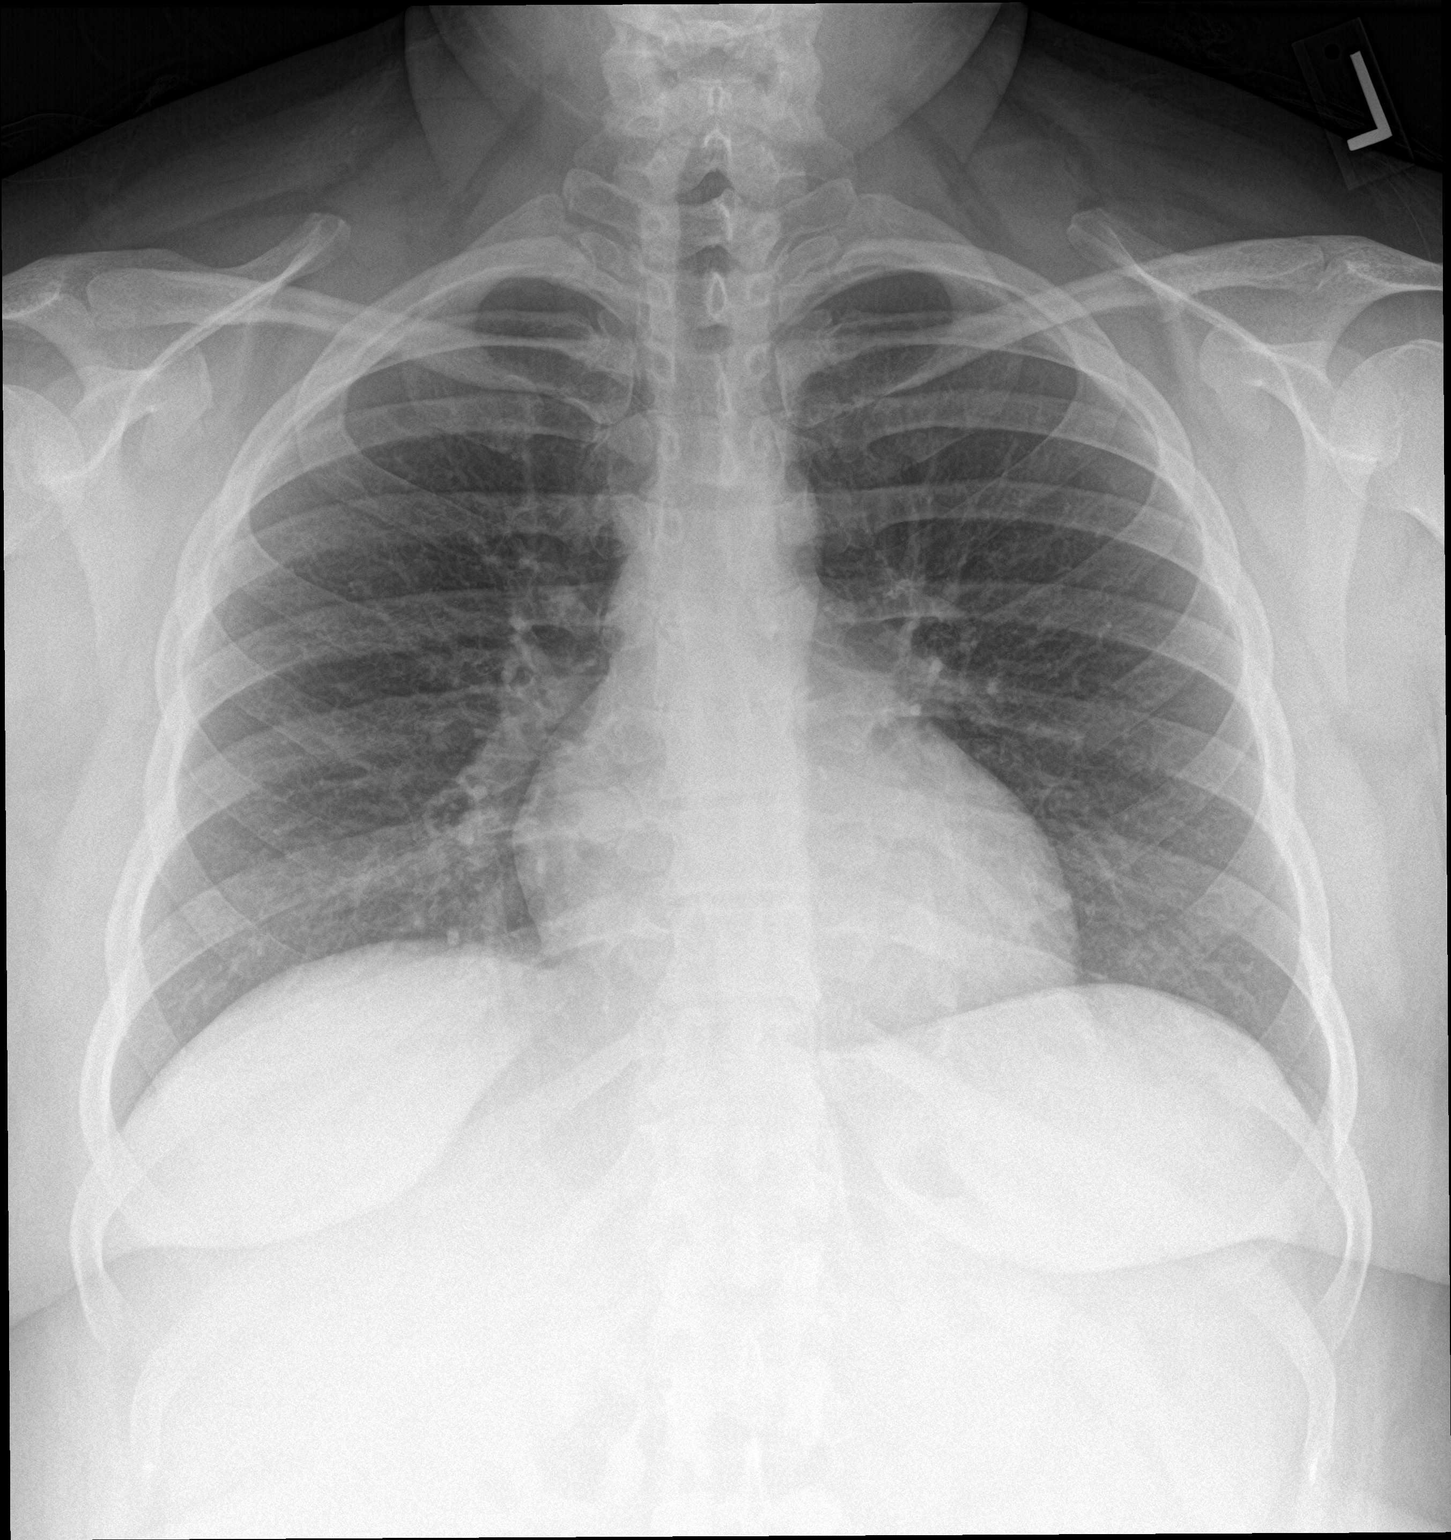

[chest lat]
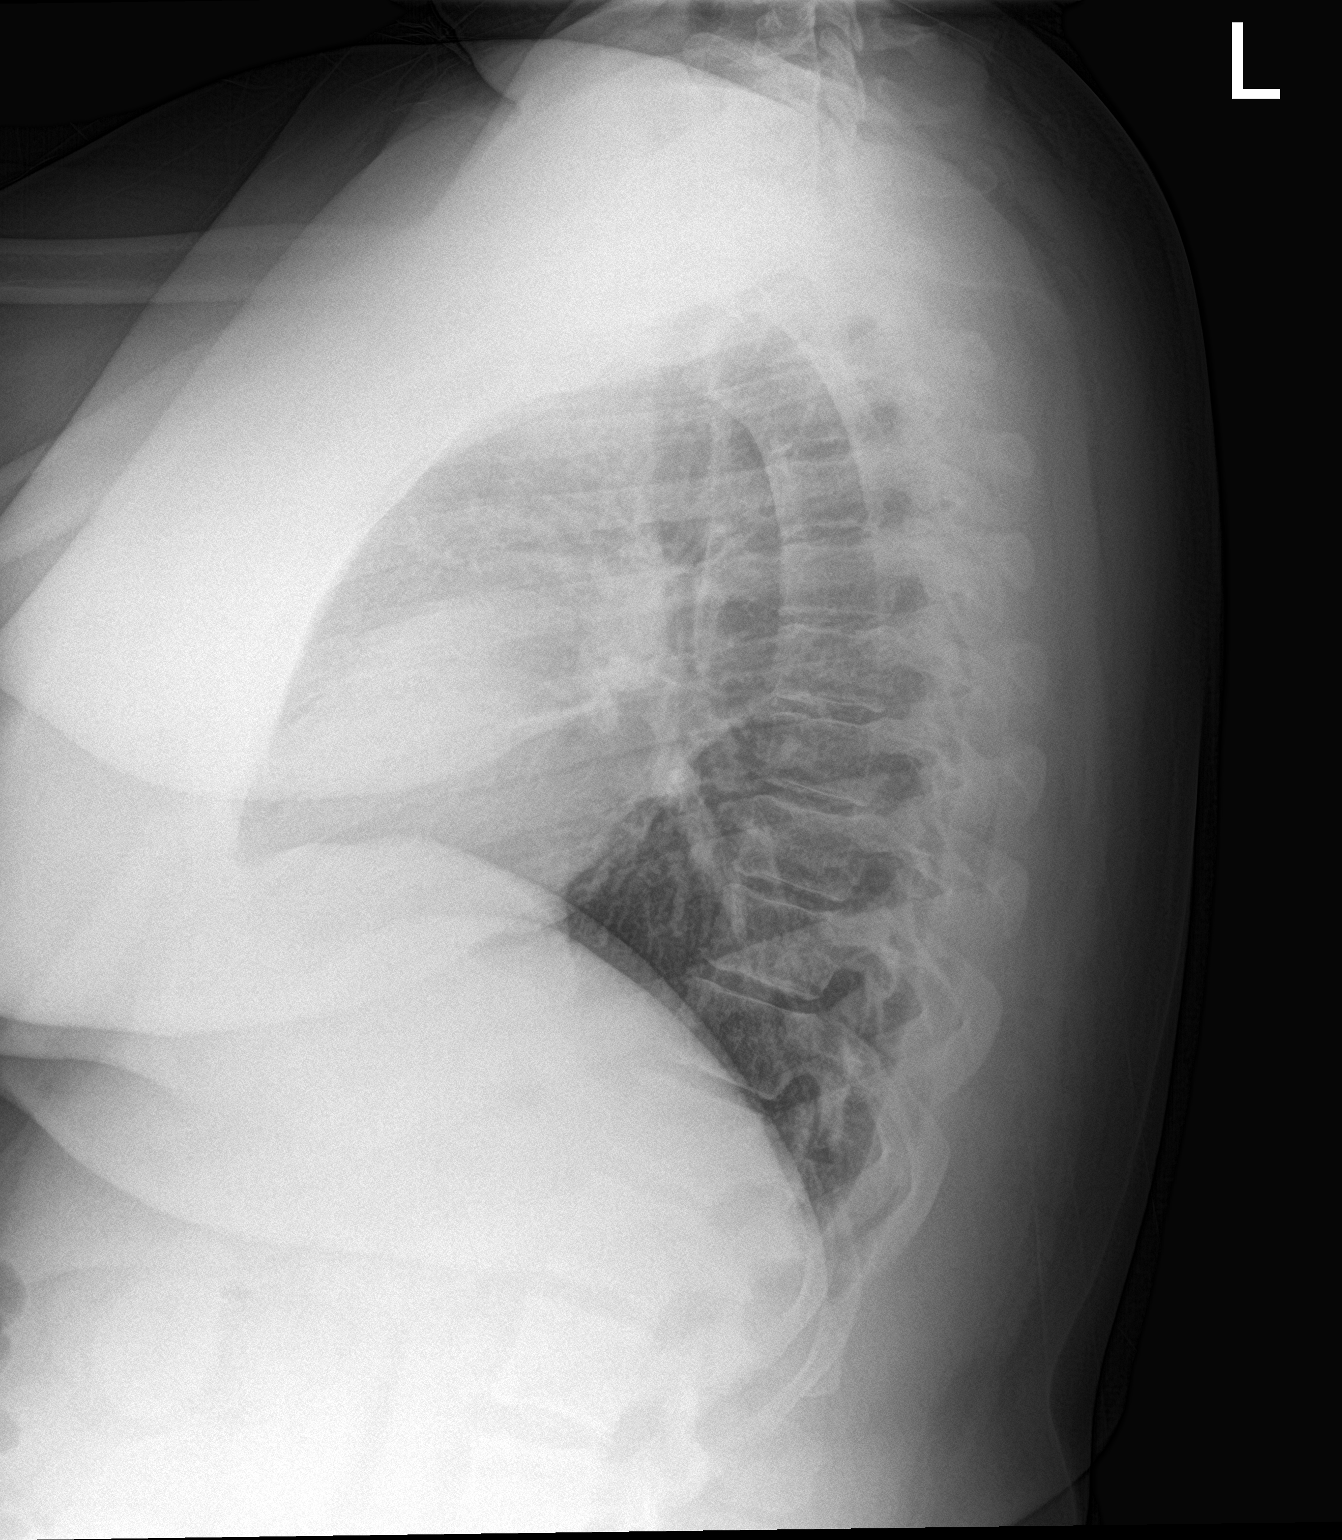

[2 of 2 positions shown; findings below may reference images not displayed]

FINDINGS: The heart size and mediastinal contours are normal. The lungs are
clear. There is no pleural effusion or pneumothorax. No acute
osseous findings are identified. The right clavicle appears
unremarkable.
IMPRESSION: No active cardiopulmonary process.

## 2020-12-20 ENCOUNTER — Ambulatory Visit: Payer: Medicaid Other | Admitting: Nurse Practitioner

## 2020-12-20 ENCOUNTER — Encounter: Payer: Self-pay | Admitting: Nurse Practitioner

## 2020-12-20 ENCOUNTER — Other Ambulatory Visit: Payer: Self-pay

## 2020-12-20 VITALS — BP 135/89 | HR 86 | Temp 98.2°F | Resp 20 | Ht 60.0 in | Wt 228.0 lb

## 2020-12-20 DIAGNOSIS — J0100 Acute maxillary sinusitis, unspecified: Secondary | ICD-10-CM | POA: Diagnosis not present

## 2020-12-20 DIAGNOSIS — B079 Viral wart, unspecified: Secondary | ICD-10-CM

## 2020-12-20 DIAGNOSIS — F419 Anxiety disorder, unspecified: Secondary | ICD-10-CM

## 2020-12-20 MED ORDER — CITALOPRAM HYDROBROMIDE 20 MG PO TABS: 20.0000 mg | ORAL_TABLET | Freq: Every day | ORAL | 3 refills | Status: DC

## 2020-12-20 MED ORDER — DOXYCYCLINE HYCLATE 100 MG PO TABS: 100.0000 mg | ORAL_TABLET | Freq: Two times a day (BID) | ORAL | 0 refills | Status: DC

## 2020-12-20 NOTE — Progress Notes (Signed)
Subjective:    Patient ID: Denise Berger, female    DOB: 18-May-1980, 40 y.o.   MRN: [id number]  Chief Complaint: Anxiety and wart -right thumb    HPI Patient comes in today with 2 complaints: - wart on right thumb-wants frozen. - anxiety- patient says she is stressed all the time. Gets angry easily. GAD 7 : Generalized Anxiety Score 12/20/2020 06/19/2020 05/23/2020 11/10/2019  Nervous, Anxious, on Edge 0 0 0 0  Control/stop worrying 0 0 0 0  Worry too much - different things 0 0 0 0  Trouble relaxing 1 0 0 0  Restless 0 0 0 0  Easily annoyed or irritable 3 0 0 0  Afraid - awful might happen 0 0 0 0  Total GAD 7 Score 4 0 0 0  Anxiety Difficulty Somewhat difficult Not difficult at all - Not difficult at all    Depression screen [facility] 2/9 12/20/2020 06/19/2020 05/23/2020  Decreased Interest 2 0 0  Down, Depressed, Hopeless 2 0 0  PHQ - 2 Score 4 0 0  Altered sleeping 2 0 0  Tired, decreased energy 1 0 0  Change in appetite 2 0 0  Feeling bad or failure about yourself  1 0 0  Trouble concentrating 0 0 0  Moving slowly or fidgety/restless 0 0 0  Suicidal thoughts 0 0 0  PHQ-9 Score 10 0 0  Difficult doing work/chores Somewhat difficult Not difficult at all -   - has had cough and congestion for over 7 days. Has lots of facial pressure that is worsening. Nasal discharge has gone from clear to green.      Review of Systems  Constitutional:  Negative for chills, diaphoresis and fatigue.  HENT:  Positive for congestion, postnasal drip, sinus pressure and sinus pain. Negative for sore throat.   Eyes:  Negative for pain.  Respiratory:  Negative for shortness of breath.   Cardiovascular:  Negative for chest pain, palpitations and leg swelling.  Gastrointestinal:  Negative for abdominal pain.  Endocrine: Negative for polydipsia.  Skin:  Negative for rash.  Neurological:  Negative for dizziness, weakness and headaches.  Hematological:  Does not bruise/bleed easily.  All other systems  reviewed and are negative.     Objective:   Physical Exam Constitutional:      Appearance: Normal appearance. She is obese.  Cardiovascular:     Rate and Rhythm: Normal rate and regular rhythm.     Heart sounds: Normal heart sounds.  Pulmonary:     Effort: Pulmonary effort is normal.     Breath sounds: Normal breath sounds.  Skin:    General: Skin is warm.     Comments: 1 cm dry flesh colored lesion on lateral side of right thumb.  Neurological:     General: No focal deficit present.     Mental Status: She is alert and oriented to person, place, and time.  Psychiatric:        Mood and Affect: Mood normal.        Behavior: Behavior normal.   BP 135/89    Pulse 86    Temp 98.2 F (36.8 C)    Resp 20    Ht 5' (1.524 m)    Wt 228 lb (103.4 kg)    LMP 12/05/2020 (Approximate) Comment: light - 3 days   SpO2 100%    BMI 44.53 kg/m   Chirotherapy of thumb wart- patient tolerated well      Assessment & Plan:  Denise Berger in today with chief complaint of Anxiety and wart -right thumb    1. Viral wart on finger chirotherapy  2. Anxiety Stress manaegment - citalopram (CELEXA) 20 MG tablet; Take 1 tablet (20 mg total) by mouth daily.  Dispense: 30 tablet; Refill: 3  3. Acute non-recurrent maxillary sinusitis 1. Take meds as prescribed 2. Use a cool mist humidifier especially during the winter months and when heat has been humid. 3. Use saline nose sprays frequently 4. Saline irrigations of the nose can be very helpful if done frequently.  * 4X daily for 1 week*  * Use of a nettie pot can be helpful with this. Follow directions with this* 5. Drink plenty of fluids 6. Keep thermostat turn down low 7.For any cough or congestion- mucinex 8. For fever or aces or pains- take tylenol or ibuprofen appropriate for age and weight.  * for fevers greater than 101 orally you may alternate ibuprofen and tylenol every  3 hours.    - doxycycline (VIBRA-TABS) 100 MG tablet; Take 1 tablet  (100 mg total) by mouth 2 (two) times daily. 1 po bid  Dispense: 20 tablet; Refill: 0    The above assessment and management plan was discussed with the patient. The patient verbalized understanding of and has agreed to the management plan. Patient is aware to call the clinic if symptoms persist or worsen. Patient is aware when to return to the clinic for a follow-up visit. Patient educated on when it is appropriate to go to the emergency department.   [name], FNP

## 2020-12-20 NOTE — Patient Instructions (Signed)

## 2021-01-10 ENCOUNTER — Encounter: Payer: Self-pay | Admitting: Nurse Practitioner

## 2021-01-10 ENCOUNTER — Telehealth: Payer: Medicaid Other | Admitting: Nurse Practitioner

## 2021-01-10 NOTE — Progress Notes (Signed)
Virtual Visit Consent   Denise Berger, you are scheduled for a virtual visit with Denise Daphine Deutscher, FNP, a [facility] provider, today.     Just as with appointments in the office, your consent must be obtained to participate.  Your consent will be active for this visit and any virtual visit you may have with one of our providers in the next 365 days.     If you have a MyChart account, a copy of this consent can be sent to you electronically.  All virtual visits are billed to your insurance company just like a traditional visit in the office.    As this is a virtual visit, video technology does not allow for your provider to perform a traditional examination.  This may limit your provider's ability to fully assess your condition.  If your provider identifies any concerns that need to be evaluated in person or the need to arrange testing (such as labs, EKG, etc.), we will make arrangements to do so.     Although advances in technology are sophisticated, we cannot ensure that it will always work on either your end or our end.  If the connection with a video visit is poor, the visit may have to be switched to a telephone visit.  With either a video or telephone visit, we are not always able to ensure that we have a secure connection.     I need to obtain your verbal consent now.   Are you willing to proceed with your visit today? YES   Denise Berger has provided verbal consent on 01/10/2021 for a virtual visit (video or telephone).   Denise Daphine Deutscher, FNP   Date: 01/10/2021 12:53 PM   Virtual Visit via Video Note   I, Denise Berger, connected with Denise Berger, 1980/04/26) on 01/10/21 at 12:30 PM EST by a video-enabled telemedicine application and verified that I am speaking with the correct person using two identifiers.  Location: Patient: Virtual Visit Location Patient: Home Provider: Virtual Visit Location Provider: Mobile   I discussed the limitations of evaluation and  management by telemedicine and the availability of in person appointments. The patient expressed understanding and agreed to proceed.    History of Present Illness: Denise Berger is a 41 y.o. who identifies as a female who was assigned female at birth, and is being seen today for weight loss.  HPI: Patient does video visit to discuss weight loss. She has been struggling with her weight her whole life. Has tried many different diets including weight watchers. Will do well at first then plateau and cannot lose anymore. Would like to try wegovy.   Review of Systems  Constitutional:  Negative for diaphoresis and weight loss.  Eyes:  Negative for blurred vision, double vision and pain.  Respiratory:  Negative for shortness of breath.   Cardiovascular:  Negative for chest pain, palpitations, orthopnea and leg swelling.  Gastrointestinal:  Negative for abdominal pain.  Skin:  Negative for rash.  Neurological:  Negative for dizziness, sensory change, loss of consciousness, weakness and headaches.  Endo/Heme/Allergies:  Negative for polydipsia. Does not bruise/bleed easily.  Psychiatric/Behavioral:  Negative for memory loss. The patient does not have insomnia.   All other systems reviewed and are negative.  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 1 po bid, Disp: 20 tablet, Rfl: 0   levonorgestrel (MIRENA) 20 MCG/DAY IUD, 1 each by Intrauterine route once., Disp: , Rfl:    phentermine (ADIPEX-P) 37.5 MG tablet, Take 1 tablet (37.5 mg total) by mouth daily before breakfast., Disp: 30 tablet, Rfl: 2  Observations/Objective: Patient is well-developed, well-nourished in no acute distress.  Resting comfortably  at home.  Head is normocephalic, atraumatic.  No labored breathing.  Speech is clear and coherent with logical content.  Patient is alert and oriented at baseline.    Assessment and Plan:  Vinessa Macconnell in today with chief complaint of obesity  1. Morbid obesity [facility]) Appointment made with clinical pharmacist to discuss weight loss and possibly being put on wegovy.    Follow Up Instructions: I discussed the assessment and treatment plan with the patient. The patient was provided an opportunity to ask questions and all were answered. The patient agreed with the plan and demonstrated an understanding of the instructions.  A copy of instructions were sent to the patient via MyChart.  The patient was advised to call back or seek an in-person evaluation if the symptoms worsen or if the condition fails to improve as anticipated.  Time:  I spent 10 minutes with the patient via telehealth technology discussing the above problems/concerns.    Denise Daphine Deutscher, FNP

## 2021-01-14 ENCOUNTER — Ambulatory Visit: Payer: Medicaid Other | Admitting: Pharmacist

## 2021-01-14 ENCOUNTER — Encounter: Payer: Self-pay | Admitting: Pharmacist

## 2021-01-14 ENCOUNTER — Telehealth: Payer: Self-pay | Admitting: Pharmacist

## 2021-01-14 MED ORDER — SEMAGLUTIDE-WEIGHT MANAGEMENT 0.25 MG/0.5ML ~~LOC~~ SOAJ: 0.2500 mg | SUBCUTANEOUS | 0 refills | Status: DC

## 2021-01-14 NOTE — Telephone Encounter (Signed)
Raynelle Fanning to follow

## 2021-01-14 NOTE — Progress Notes (Signed)
° ° ° °  01/14/2021 Name: Denise Berger MRN: [id number] DOB: [birth year]   S:  45 yoF Presents for weight loss evaluation, education, and management Patient was referred and last seen by Primary Care Provider on 01/10/21.  She is interested in starting medication to aid with weight loss endeavors.  She has tried phentermine in the past, however this has not proven to be effective.  Insurance coverage/medication affordability: [place] medicaid   O:  Lipid Panel     Component Value Date/Time   CHOL 156 06/19/2020 1524   TRIG 113 06/19/2020 1524   TRIG 206 (H) 02/14/2014 1551   HDL 41 06/19/2020 1524   HDL 34 (L) 02/14/2014 1551   CHOLHDL 3.8 06/19/2020 1524   LDLCALC 94 06/19/2020 1524   LDLCALC 94 10/26/2013 1114   Current weight 228lbs  BMI 44.5  A/P:  Patient's current BMI is 44.5 (obese)  Would prefer to start patient on GLP1 therapy that is approved for weight loss, however patient currently has [place] Medicaid which does not cover weight loss medications at this time  Recommended healthy plate method; DASH eating plan: Eating vegetables, fruits, and whole grains Including fat-free or low-fat dairy products, fish, poultry, beans, nuts, and vegetable oils Limiting foods that are high in saturated fat, such as fatty meats, full-fat dairy products, and tropical oils such as coconut, palm kernel, and palm oils Limiting sugar-sweetened beverages and sweets  Recommend increase exercise and water intake  Written patient instructions provided.  Total time in face to face counseling 30 minutes.   Kieth Brightly, PharmD, BCPS Clinical Pharmacist, [facility]  II Phone [phone]

## 2021-01-16 NOTE — Telephone Encounter (Signed)
DENIED -- PLEASE LET PATIENT KNOW  MEDICAID WILL NOT COVER WEIGHT LOSS MEDICATIONS THIS YEAR.  STAY TUNED FOR NEXT YEAR 2024

## 2021-01-16 NOTE — Telephone Encounter (Signed)
Patient aware and verbalized understanding. °

## 2021-01-30 NOTE — Telephone Encounter (Signed)
No the only other thing would be phentermine or otherwise known as adipex which you have already tried.

## 2021-02-03 ENCOUNTER — Telehealth: Payer: Medicaid Other | Admitting: Emergency Medicine

## 2021-02-03 DIAGNOSIS — B9689 Other specified bacterial agents as the cause of diseases classified elsewhere: Secondary | ICD-10-CM | POA: Diagnosis not present

## 2021-02-03 DIAGNOSIS — J019 Acute sinusitis, unspecified: Secondary | ICD-10-CM

## 2021-02-03 MED ORDER — DOXYCYCLINE HYCLATE 100 MG PO TABS: 100.0000 mg | ORAL_TABLET | Freq: Two times a day (BID) | ORAL | 0 refills | Status: DC

## 2021-02-03 NOTE — Progress Notes (Signed)

## 2021-02-21 ENCOUNTER — Ambulatory Visit: Payer: Medicaid Other | Admitting: Nurse Practitioner

## 2021-02-21 ENCOUNTER — Encounter: Payer: Self-pay | Admitting: Nurse Practitioner

## 2021-02-21 VITALS — BP 144/88 | HR 76 | Temp 97.8°F | Resp 20 | Ht 60.0 in | Wt 241.0 lb

## 2021-02-21 DIAGNOSIS — R5382 Chronic fatigue, unspecified: Secondary | ICD-10-CM

## 2021-02-21 DIAGNOSIS — R251 Tremor, unspecified: Secondary | ICD-10-CM

## 2021-02-21 NOTE — Progress Notes (Signed)
Subjective:    Patient ID: Denise Berger, female    DOB: Jul 18, 1980, 40 y.o.   MRN: [id number]   Chief Complaint: feels bad  URI  Pertinent negatives include no abdominal pain, chest pain, headaches or rash.  Patient comes in today not feeling well. For the  last several weeks she has been having sweats and fatigue. If she eats something sweet she feels better. She thinks that her blood sugars are dropping. She has never been dx with blood sugar problems. She says she just has no energy .    Review of Systems  Constitutional:  Negative for diaphoresis.  Eyes:  Negative for pain.  Respiratory:  Negative for shortness of breath.   Cardiovascular:  Negative for chest pain, palpitations and leg swelling.  Gastrointestinal:  Negative for abdominal pain.  Endocrine: Negative for polydipsia.  Skin:  Negative for rash.  Neurological:  Negative for dizziness, weakness and headaches.  Hematological:  Does not bruise/bleed easily.  All other systems reviewed and are negative.     Objective:   Physical Exam Vitals and nursing note reviewed.  Constitutional:      General: She is not in acute distress.    Appearance: Normal appearance. She is well-developed.  HENT:     Head: Normocephalic.     Right Ear: Tympanic membrane normal.     Left Ear: Tympanic membrane normal.     Nose: Nose normal.     Mouth/Throat:     Mouth: Mucous membranes are moist.  Eyes:     Pupils: Pupils are equal, round, and reactive to light.  Neck:     Vascular: No carotid bruit or JVD.  Cardiovascular:     Rate and Rhythm: Normal rate and regular rhythm.     Heart sounds: Normal heart sounds.  Pulmonary:     Effort: Pulmonary effort is normal. No respiratory distress.     Breath sounds: Normal breath sounds. No wheezing or rales.  Chest:     Chest wall: No tenderness.  Abdominal:     General: Bowel sounds are normal. There is no distension or abdominal bruit.     Palpations: Abdomen is soft. There is no  hepatomegaly, splenomegaly, mass or pulsatile mass.     Tenderness: There is no abdominal tenderness.  Musculoskeletal:        General: Normal range of motion.     Cervical back: Normal range of motion and neck supple.  Lymphadenopathy:     Cervical: No cervical adenopathy.  Skin:    General: Skin is warm and dry.  Neurological:     Mental Status: She is alert and oriented to person, place, and time.     Deep Tendon Reflexes: Reflexes are normal and symmetric.  Psychiatric:        Behavior: Behavior normal.        Thought Content: Thought content normal.        Judgment: Judgment normal.    BP (!) 144/88    Pulse 76    Temp 97.8 F (36.6 C) (Oral)    Resp 20    Ht 5' (1.524 m)    Wt 241 lb (109.3 kg)    SpO2 100%    BMI 47.07 kg/m        Assessment & Plan:   Denise Berger in today with chief complaint of URI   1. Chronic fatigue - CBC with Differential/Platelet - CMP14+EGFR - Thyroid Panel With TSH  2. Shakiness Labs pending Eat 6 small  meals a day - CBC with Differential/Platelet - CMP14+EGFR - Thyroid Panel With TSH    The above assessment and management plan was discussed with the patient. The patient verbalized understanding of and has agreed to the management plan. Patient is aware to call the clinic if symptoms persist or worsen. Patient is aware when to return to the clinic for a follow-up visit. Patient educated on when it is appropriate to go to the emergency department.   Mary-Margaret Hassell Done, FNP

## 2021-02-21 NOTE — Patient Instructions (Signed)
Fatigue °If you have fatigue, you feel tired all the time and have a lack of energy or a lack of motivation. Fatigue may make it difficult to start or complete tasks because of exhaustion. In general, occasional or mild fatigue is often a normal response to activity or life. However, long-lasting (chronic) or extreme fatigue may be a symptom of a medical condition. °Follow these instructions at home: °General instructions °Watch your fatigue for any changes. °Go to bed and get up at the same time every day. °Avoid fatigue by pacing yourself during the day and getting enough sleep at night. °Maintain a healthy weight. °Medicines °Take over-the-counter and prescription medicines only as told by your health care provider. °Take a multivitamin, if told by your health care provider.  °Do not use herbal or dietary supplements unless they are approved by your health care provider. °Activity ° °Exercise regularly, as told by your health care provider. °Use or practice techniques to help you relax, such as yoga, tai chi, meditation, or massage therapy. °Eating and drinking ° °Avoid heavy meals in the evening. °Eat a well-balanced diet, which includes lean proteins, whole grains, plenty of fruits and vegetables, and low-fat dairy products. °Avoid consuming too much caffeine. °Avoid the use of alcohol. °Drink enough fluid to keep your urine pale yellow. °Lifestyle °Change situations that cause you stress. Try to keep your work and personal schedule in balance. °Do not use any products that contain nicotine or tobacco, such as cigarettes and e-cigarettes. If you need help quitting, ask your health care provider. °Do not use drugs. °Contact a health care provider if: °Your fatigue does not get better. °You have a fever. °You suddenly lose or gain weight. °You have headaches. °You have trouble falling asleep or sleeping through the night. °You feel angry, guilty, anxious, or sad. °You are unable to have a bowel movement  (constipation). °Your skin is dry. °You have swelling in your legs or another part of your body. °Get help right away if: °You feel confused. °Your vision is blurry. °You feel faint or you pass out. °You have a severe headache. °You have severe pain in your abdomen, your back, or the area between your waist and hips (pelvis). °You have chest pain, shortness of breath, or an irregular or fast heartbeat. °You are unable to urinate, or you urinate less than normal. °You have abnormal bleeding, such as bleeding from the rectum, vagina, nose, lungs, or nipples. °You vomit blood. °You have thoughts about hurting yourself or others. °If you ever feel like you may hurt yourself or others, or have thoughts about taking your own life, get help right away. You can go to your nearest emergency department or call: °Your local emergency services (911 in the [place] °A suicide crisis helpline, such as the National Suicide Prevention Lifeline at 1-800-273-8255 or 988 in the [place] This is open 24 hours a day. °Summary °If you have fatigue, you feel tired all the time and have a lack of energy or a lack of motivation. °Fatigue may make it difficult to start or complete tasks because of exhaustion. °Long-lasting (chronic) or extreme fatigue may be a symptom of a medical condition. °Exercise regularly, as told by your health care provider. °Change situations that cause you stress. Try to keep your work and personal schedule in balance. °This information is not intended to replace advice given to you by your health care provider. Make sure you discuss any questions you have with your health care provider. °Document Revised:   07/17/2020 Document Reviewed: 11/02/2019 °Elsevier Patient Education © 2022 Elsevier Inc. ° °

## 2021-02-22 LAB — CBC WITH DIFFERENTIAL/PLATELET
Basophils Absolute: 0.1 10*3/uL (ref 0.0–0.2)
Basos: 1 %
EOS (ABSOLUTE): 0.1 10*3/uL (ref 0.0–0.4)
Eos: 1 %
Hematocrit: 39.1 % (ref 34.0–46.6)
Hemoglobin: 13.1 g/dL (ref 11.1–15.9)
Immature Grans (Abs): 0.1 10*3/uL (ref 0.0–0.1)
Immature Granulocytes: 1 %
Lymphocytes Absolute: 2 10*3/uL (ref 0.7–3.1)
Lymphs: 26 %
MCH: 28.7 pg (ref 26.6–33.0)
MCHC: 33.5 g/dL (ref 31.5–35.7)
MCV: 86 fL (ref 79–97)
Monocytes Absolute: 0.5 10*3/uL (ref 0.1–0.9)
Monocytes: 7 %
Neutrophils Absolute: 5 10*3/uL (ref 1.4–7.0)
Neutrophils: 64 %
Platelets: 325 10*3/uL (ref 150–450)
RBC: 4.56 x10E6/uL (ref 3.77–5.28)
RDW: 13.2 % (ref 11.7–15.4)
WBC: 7.7 10*3/uL (ref 3.4–10.8)

## 2021-02-22 LAB — CMP14+EGFR
ALT: 17 IU/L (ref 0–32)
AST: 17 IU/L (ref 0–40)
Albumin/Globulin Ratio: 1.7 (ref 1.2–2.2)
Albumin: 4.3 g/dL (ref 3.8–4.8)
Alkaline Phosphatase: 51 IU/L (ref 44–121)
BUN/Creatinine Ratio: 15 (ref 9–23)
BUN: 8 mg/dL (ref 6–20)
Bilirubin Total: 0.3 mg/dL (ref 0.0–1.2)
CO2: 25 mmol/L (ref 20–29)
Calcium: 9.3 mg/dL (ref 8.7–10.2)
Chloride: 101 mmol/L (ref 96–106)
Creatinine, Ser: 0.55 mg/dL — ABNORMAL LOW (ref 0.57–1.00)
Globulin, Total: 2.5 g/dL (ref 1.5–4.5)
Glucose: 94 mg/dL (ref 70–99)
Potassium: 4.7 mmol/L (ref 3.5–5.2)
Sodium: 139 mmol/L (ref 134–144)
Total Protein: 6.8 g/dL (ref 6.0–8.5)
eGFR: 120 mL/min/{1.73_m2} (ref >59)

## 2021-02-22 LAB — THYROID PANEL WITH TSH
Free Thyroxine Index: 1.4 (ref 1.2–4.9)
T3 Uptake Ratio: 18 % — ABNORMAL LOW (ref 24–39)
T4, Total: 7.5 ug/dL (ref 4.5–12.0)
TSH: 2.62 u[IU]/mL (ref 0.450–4.500)

## 2021-04-21 DIAGNOSIS — F419 Anxiety disorder, unspecified: Secondary | ICD-10-CM

## 2021-04-21 MED ORDER — CITALOPRAM HYDROBROMIDE 20 MG PO TABS: 20.0000 mg | ORAL_TABLET | Freq: Every day | ORAL | 3 refills | Status: DC

## 2021-05-17 IMAGING — DX DG HIP (WITH OR WITHOUT PELVIS) 2-3V*L*
3 series · 3 of 3 positions shown · non-contrast
Comparison: None.

CLINICAL DATA: Left hip and knee pain since falling 2 weeks ago.

EXAM:
DG HIP (WITH OR WITHOUT PELVIS) 2-3V LEFT

[pelvis ap]
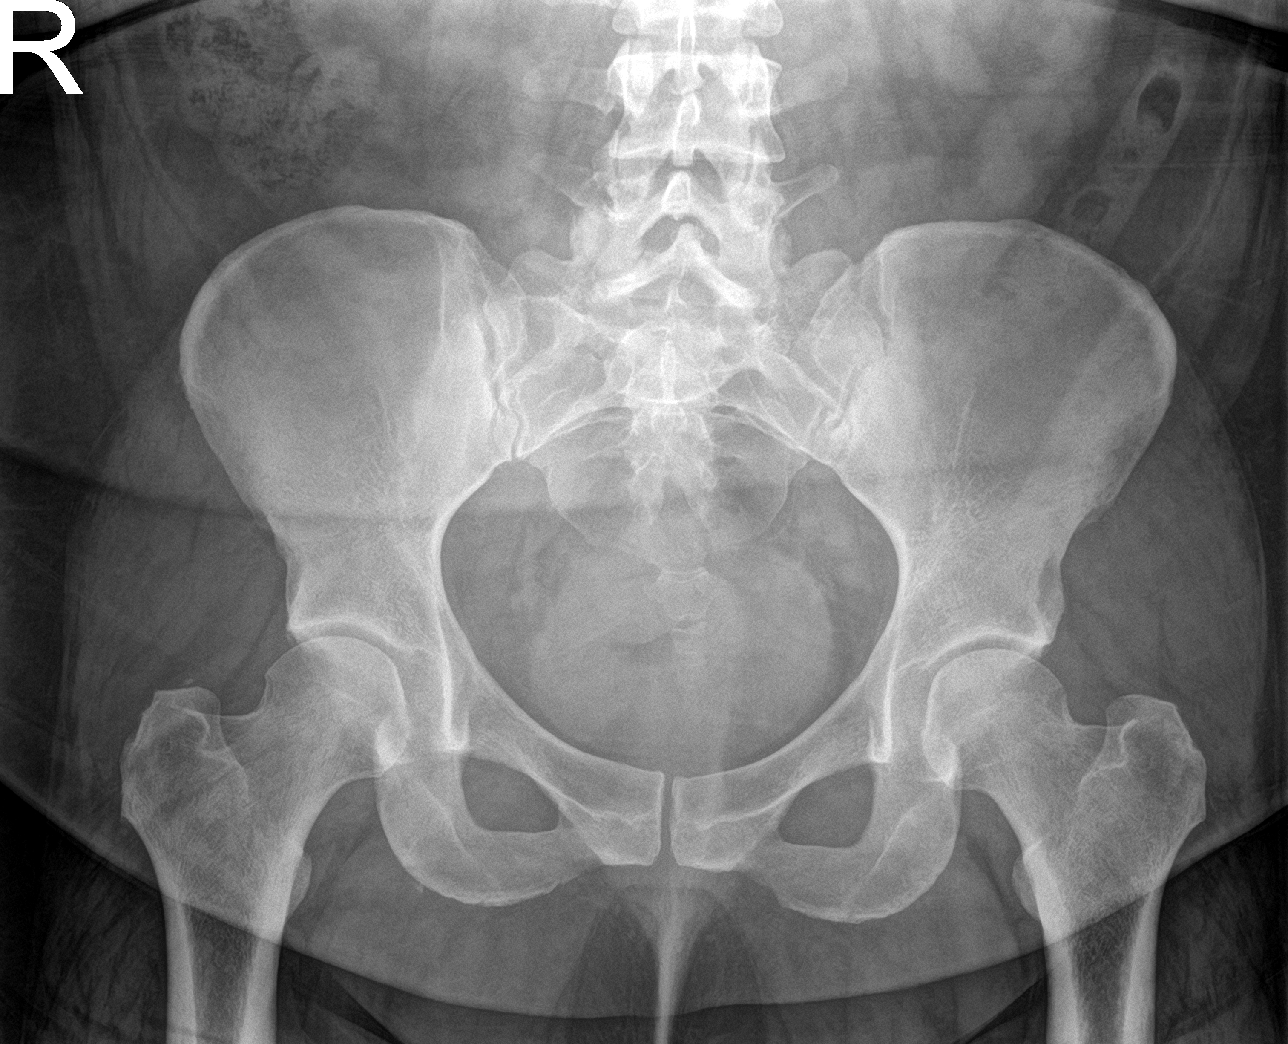

[hip ap]
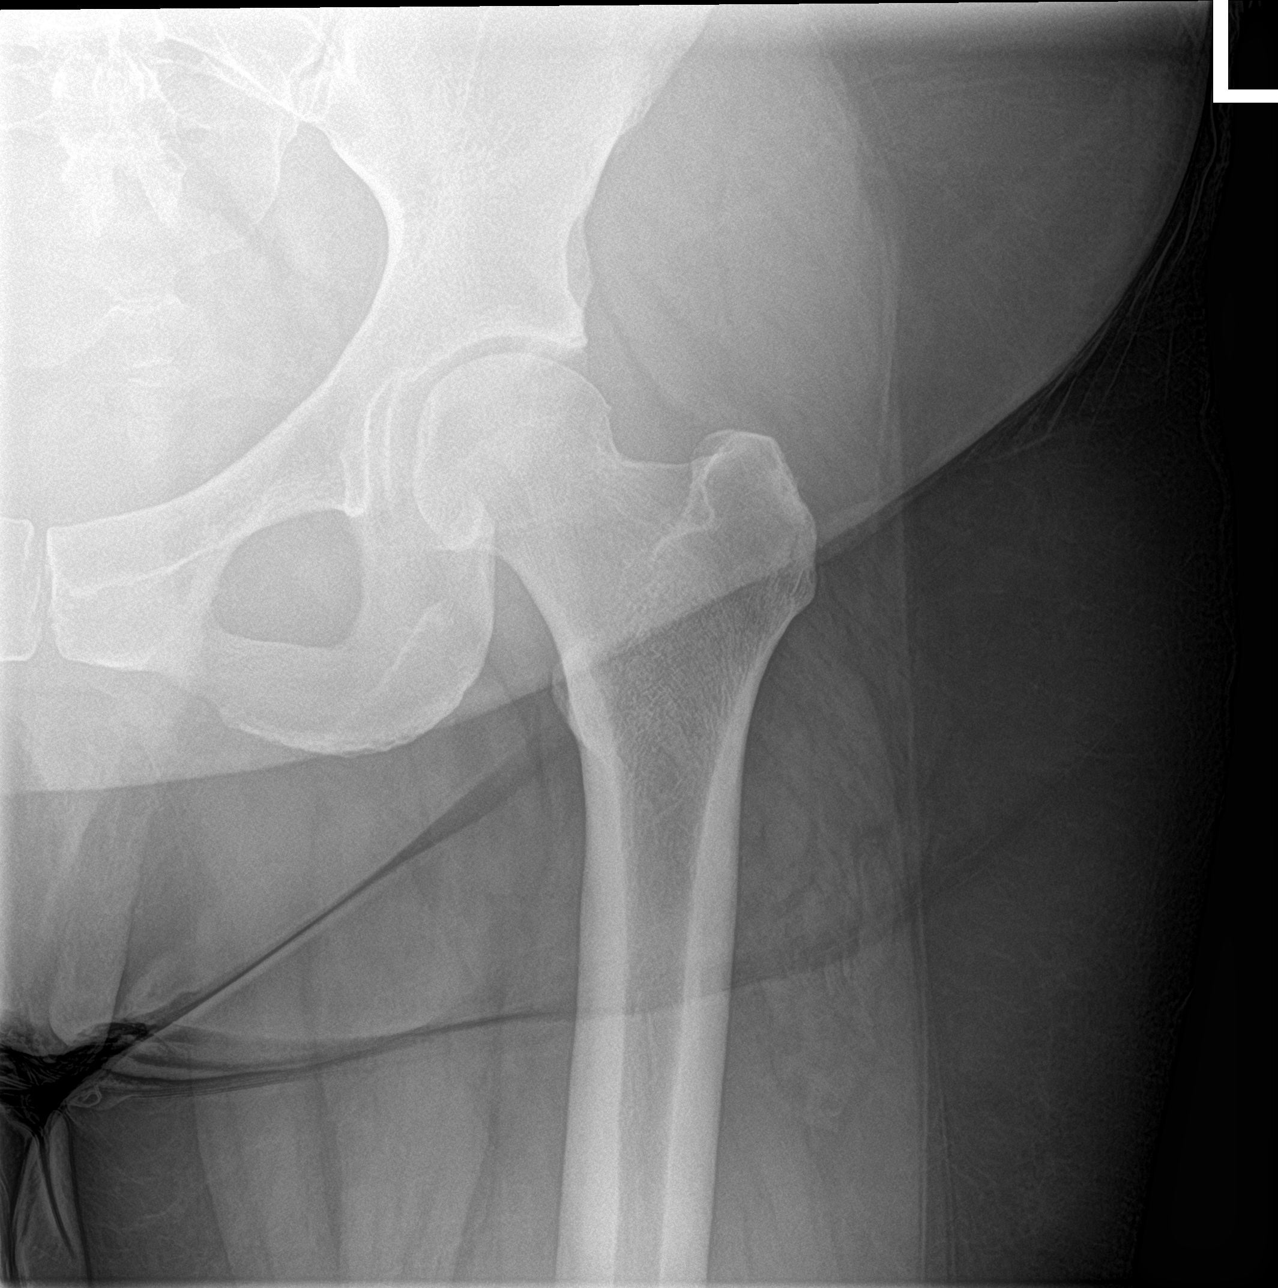

[hip lat]
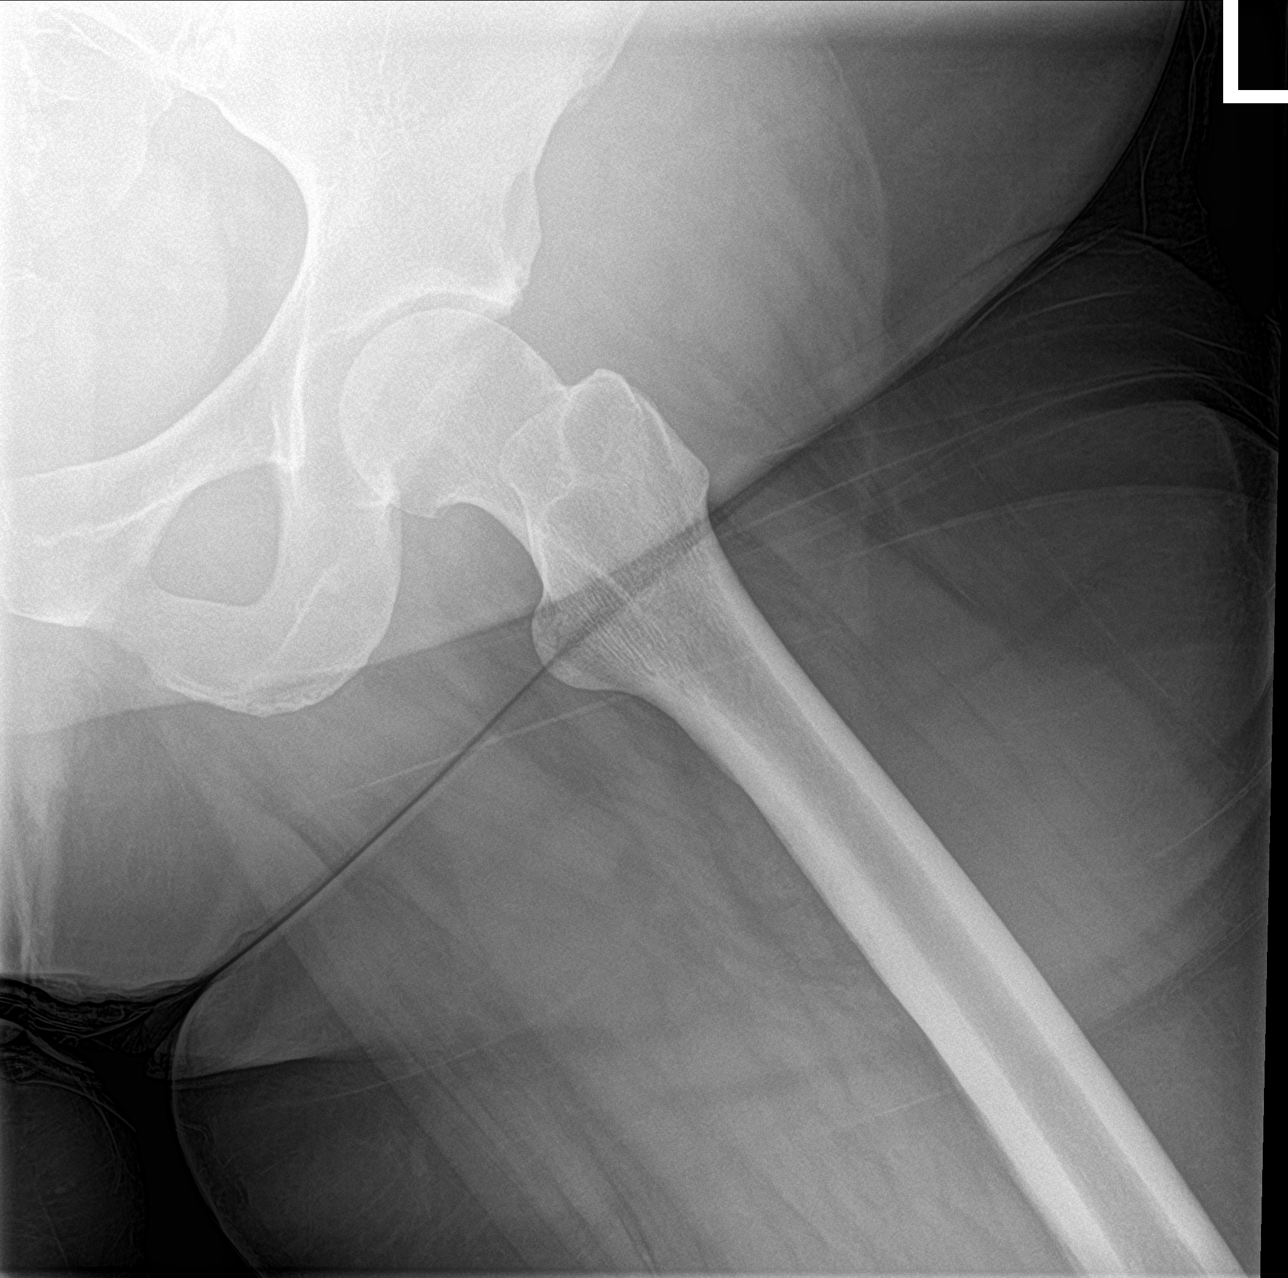

[3 of 3 positions shown; findings below may reference images not displayed]

FINDINGS: The mineralization and alignment are normal. There is no evidence of
acute fracture or dislocation. The hip joint spaces are preserved.
The sacroiliac joints appear normal. No evidence of femoral head
avascular necrosis. The soft tissues appear unremarkable.
IMPRESSION: Negative left hip radiographs.

## 2021-08-18 ENCOUNTER — Ambulatory Visit: Payer: Medicaid Other | Admitting: Nurse Practitioner

## 2021-08-18 ENCOUNTER — Encounter: Payer: Self-pay | Admitting: Nurse Practitioner

## 2021-08-18 VITALS — BP 164/102 | HR 77 | Temp 98.0°F | Resp 20 | Ht 60.0 in | Wt 258.0 lb

## 2021-08-18 DIAGNOSIS — F419 Anxiety disorder, unspecified: Secondary | ICD-10-CM

## 2021-08-18 DIAGNOSIS — R1011 Right upper quadrant pain: Secondary | ICD-10-CM | POA: Diagnosis not present

## 2021-08-18 MED ORDER — ONDANSETRON HCL 4 MG PO TABS: 4.0000 mg | ORAL_TABLET | Freq: Three times a day (TID) | ORAL | 0 refills | Status: DC | PRN

## 2021-08-18 MED ORDER — CITALOPRAM HYDROBROMIDE 20 MG PO TABS: 20.0000 mg | ORAL_TABLET | Freq: Every day | ORAL | 1 refills | Status: DC

## 2021-08-18 NOTE — Patient Instructions (Signed)
Gallbladder Eating Plan High blood cholesterol, obesity, a sedentary lifestyle, an unhealthy diet, and diabetes are risk factors for developing gallstones. If you have a gallbladder condition, you may have trouble digesting fats and tolerating high fat intake. Eating a low-fat diet can help reduce your symptoms and may be helpful before and after having surgery to remove your gallbladder (cholecystectomy). Your health care provider may recommend that you work with a dietitian to help you reduce the amount of fat in your diet. What are tips for following this plan? General guidelines Limit your fat intake to less than 30% of your total daily calories. If you eat around 1,800 calories each day, this means eating less than 60 grams (g) of fat per day. Fat is an important part of a healthy diet. Eating a low-fat diet can make it hard to maintain a healthy body weight. Ask your dietitian how much fat, calories, and other nutrients you need each day. Eat small, frequent meals throughout the day instead of three large meals. Drink at least 8-10 cups (1.9-2.4 L) of fluid a day. Drink enough fluid to keep your urine pale yellow. If you drink alcohol: Limit how much you have to: 0-1 drink a day for women who are not pregnant. 0-2 drinks a day for men. Know how much alcohol is in a drink. In the [place] one drink equals one 12 oz bottle of beer (355 mL), one 5 oz glass of wine (148 mL), or one 1 oz glass of hard liquor (44 mL). Reading food labels  Check nutrition facts on food labels for the amount of fat per serving. Choose foods with less than 3 grams of fat per serving. Shopping Choose nonfat and low-fat healthy foods. Look for the words "nonfat," "low-fat," or "fat-free." Avoid buying processed or prepackaged foods. Cooking Cook using low-fat methods, such as baking, broiling, grilling, or boiling. Cook with small amounts of healthy fats, such as olive oil, grapeseed oil, canola oil, avocado oil, or  sunflower oil. What foods are recommended?  All fresh, frozen, or canned fruits and vegetables. Whole grains. Low-fat or nonfat (skim) milk and yogurt. Lean meat, skinless poultry, fish, eggs, and beans. Low-fat protein supplement powders or drinks. Spices and herbs. The items listed above may not be a complete list of foods and beverages you can eat and drink. Contact a dietitian for more information. What foods are not recommended? High-fat foods. These include baked goods, fast food, fatty cuts of meat, ice cream, french toast, sweet rolls, pizza, cheese bread, foods covered with butter, creamy sauces, or cheese. Fried foods. These include french fries, tempura, battered fish, breaded chicken, fried breads, and sweets. Foods that cause bloating and gas. The items listed above may not be a complete list of foods that you should avoid. Contact a dietitian for more information. Summary A low-fat diet can be helpful if you have a gallbladder condition, or before and after gallbladder surgery. Limit your fat intake to less than 30% of your total daily calories. This is about 60 g of fat if you eat 1,800 calories each day. Eat small, frequent meals throughout the day instead of three large meals. This information is not intended to replace advice given to you by your health care provider. Make sure you discuss any questions you have with your health care provider. Document Revised: 12/06/2020 Document Reviewed: 12/06/2020 Elsevier Patient Education  2023 Elsevier Inc.  

## 2021-08-18 NOTE — Progress Notes (Signed)
   Subjective:    Patient ID: Denise Berger, female    DOB: [birth year], 41 y.o.   MRN: [id number]   Chief Complaint: Nausea and Abdominal Pain (For 15 days. Worse after eating. Concerned about her gallbladder)   Patient comes in c/o abdominal pain that started about 2weeks ago.  Abdominal Pain This is a new problem. The current episode started 1 to 4 weeks ago. The onset quality is sudden. The problem occurs constantly. The problem has been gradually worsening. The pain is located in the RUQ and epigastric region. The pain is at a severity of 8/10. The pain is moderate. The quality of the pain is aching and sharp. The abdominal pain radiates to the epigastric region and back. Associated symptoms include belching, nausea and vomiting. Pertinent negatives include no constipation, diarrhea or fever. The pain is aggravated by eating. The pain is relieved by Nothing. Treatments tried: tums. The treatment provided no relief.    Review of Systems  Constitutional:  Negative for fever.  Gastrointestinal:  Positive for abdominal pain, nausea and vomiting. Negative for constipation and diarrhea.  Genitourinary: Negative.        Objective:   Physical Exam Constitutional:      Appearance: She is well-developed. She is obese.  Cardiovascular:     Rate and Rhythm: Normal rate and regular rhythm.     Heart sounds: Normal heart sounds.  Pulmonary:     Effort: Pulmonary effort is normal.     Breath sounds: Normal breath sounds.  Abdominal:     General: Bowel sounds are normal.     Palpations: Abdomen is soft.     Tenderness: There is abdominal tenderness (RUQ).     Comments: Positive murphy sign  Neurological:     General: No focal deficit present.     Mental Status: She is alert.  Psychiatric:        Mood and Affect: Mood normal.        Behavior: Behavior normal.     BP (!) 164/102   Pulse 77   Temp 98 F (36.7 C) (Temporal)   Resp 20   Ht 5' (1.524 m)   Wt 258 lb (117 kg)   SpO2 97%    BMI 50.39 kg/m        Assessment & Plan:  Denise Berger in today with chief complaint of Nausea and Abdominal Pain (For 15 days. Worse after eating. Concerned about her gallbladder)   1. RUQ pain Avoid all spicy and fatty foods Bland diet - ondansetron (ZOFRAN) 4 MG tablet; Take 1 tablet (4 mg total) by mouth every 8 (eight) hours as needed for nausea or vomiting.  Dispense: 20 tablet; Refill: 0 - US Abdomen Limited RUQ (LIVER/GB); Future  2. Anxiety Stress management - citalopram (CELEXA) 20 MG tablet; Take 1 tablet (20 mg total) by mouth daily.  Dispense: 90 tablet; Refill: 1    The above assessment and management plan was discussed with the patient. The patient verbalized understanding of and has agreed to the management plan. Patient is aware to call the clinic if symptoms persist or worsen. Patient is aware when to return to the clinic for a follow-up visit. Patient educated on when it is appropriate to go to the emergency department.   [name], FNP

## 2021-08-19 ENCOUNTER — Ambulatory Visit (HOSPITAL_COMMUNITY)
Admission: RE | Admit: 2021-08-19 | Discharge: 2021-08-19 | Disposition: A | Discharge status: Home / Self Care | Payer: Medicaid Other | Source: Ambulatory Visit | Attending: Nurse Practitioner | Admitting: Nurse Practitioner

## 2021-08-19 DIAGNOSIS — R1011 Right upper quadrant pain: Secondary | ICD-10-CM | POA: Diagnosis not present

## 2021-08-19 MED ORDER — LANSOPRAZOLE 30 MG PO CPDR
30.0000 mg | DELAYED_RELEASE_CAPSULE | Freq: Every day | ORAL | 1 refills | Status: DC
Start: 2021-08-19 — End: 2022-02-27

## 2021-08-19 NOTE — Addendum Note (Signed)
Addended by: Bennie Pierini on: 08/19/2021 01:24 PM   Modules accepted: Orders

## 2021-09-25 ENCOUNTER — Ambulatory Visit: Payer: Medicaid Other | Admitting: Nurse Practitioner

## 2021-09-25 ENCOUNTER — Encounter: Payer: Self-pay | Admitting: Nurse Practitioner

## 2021-09-25 VITALS — BP 137/84 | HR 79 | Temp 97.8°F | Resp 20 | Ht 60.0 in | Wt 263.0 lb

## 2021-09-25 DIAGNOSIS — L247 Irritant contact dermatitis due to plants, except food: Secondary | ICD-10-CM | POA: Diagnosis not present

## 2021-09-25 MED ORDER — METHYLPREDNISOLONE ACETATE 80 MG/ML IJ SUSP
80.0000 mg | Freq: Once | INTRAMUSCULAR | Status: AC
  Administered 2021-09-25: 80 mg via INTRAMUSCULAR

## 2021-09-25 NOTE — Progress Notes (Signed)
   Subjective:    Patient ID: Denise Berger, female    DOB: [birth year], 41 y.o.   MRN: [id number]   Chief Complaint: Rash (All over/)   Rash This is a new problem. The current episode started in the past 7 days. The problem has been gradually improving since onset. The affected locations include the abdomen, left arm, face and right hand. The rash is characterized by itchiness, pain and redness. It is unknown (pulling weeds last week; unsure of any specific exposure) if there was an exposure to a precipitant. Pertinent negatives include no congestion, eye pain or shortness of breath. Past treatments include anti-itch cream, cold compress and antihistamine (zinc ointment). The treatment provided mild relief.       Review of Systems  Constitutional:  Negative for diaphoresis.  HENT:  Negative for congestion.   Eyes:  Negative for pain.  Respiratory:  Negative for shortness of breath.   Cardiovascular:  Negative for chest pain, palpitations and leg swelling.  Gastrointestinal:  Negative for abdominal pain.  Skin:  Positive for rash.       itching  Neurological:  Negative for dizziness, weakness and headaches.  All other systems reviewed and are negative.      Objective:   Physical Exam Nursing note reviewed.  Constitutional:      General: She is not in acute distress.    Appearance: Normal appearance. She is well-developed and well-groomed.  HENT:     Head: Normocephalic and atraumatic.     Nose: Nose normal.  Cardiovascular:     Rate and Rhythm: Normal rate and regular rhythm.     Pulses: Normal pulses.     Heart sounds: Normal heart sounds.  Pulmonary:     Breath sounds: No wheezing, rhonchi or rales.  Chest:     Chest wall: No tenderness.  Musculoskeletal:        General: No swelling or tenderness.     Cervical back: Normal range of motion.  Lymphadenopathy:     Cervical: No cervical adenopathy.  Skin:    General: Skin is warm and dry.     Findings: Rash present. Rash  is macular, papular and urticarial. Rash is not crusting, pustular or scaling.     Comments: Erythematous, maculopapular rash to lower right abdomen and abdominal folds, R hand, L anticubital fossa, face under eyes. No drainage or crusting noted.  Neurological:     Mental Status: She is alert and oriented to person, place, and time.    BP 137/84   Pulse 79   Temp 97.8 F (36.6 C) (Temporal)   Resp 20   Ht 5' (1.524 m)   Wt 263 lb (119.3 kg)   SpO2 95%   BMI 51.36 kg/m         Assessment & Plan:  Denise Berger in today with chief complaint of Rash (All over/)   1. Irritant contact dermatitis due to plants, except food Cool compresses Avoid scratching RTO prn - methylPREDNISolone acetate (DEPO-MEDROL) injection 80 mg    The above assessment and management plan was discussed with the patient. The patient verbalized understanding of and has agreed to the management plan. Patient is aware to call the clinic if symptoms persist or worsen. Patient is aware when to return to the clinic for a follow-up visit. Patient educated on when it is appropriate to go to the emergency department.   Mary-Margaret Hassell Done, FNP

## 2021-09-25 NOTE — Patient Instructions (Signed)
Poison Ivy Dermatitis Poison ivy dermatitis is inflammation of the skin that is caused by chemicals in the leaves of the poison ivy plant. The skin reaction often involves redness, swelling, blisters, and extreme itching. What are the causes? This condition is caused by a chemical (urushiol) found in the sap of the poison ivy plant. This chemical is sticky and can be easily spread to people, animals, and objects. You can get poison ivy dermatitis by: Having direct contact with a poison ivy plant. Touching animals, other people, or objects that have come in contact with poison ivy and have the chemical on them. What increases the risk? This condition is more likely to develop in people who: Are outdoors often in wooded or marshy areas. Go outdoors without wearing protective clothing, such as closed shoes, long pants, and a long-sleeved shirt. What are the signs or symptoms? Symptoms of this condition include: Redness of the skin. Extreme itching. A rash that often includes bumps and blisters. The rash usually appears 48 hours after exposure, if you have been exposed before. If this is the first time you have been exposed, the rash may not appear until a week after exposure. Swelling. This may occur if the reaction is more severe. Symptoms usually last for 1-2 weeks. However, the first time you develop this condition, symptoms may last 3-4 weeks. How is this diagnosed? This condition may be diagnosed based on your symptoms and a physical exam. Your health care provider may also ask you about any recent outdoor activity. How is this treated? Treatment for this condition will vary depending on how severe it is. Treatment may include: Hydrocortisone cream or calamine lotion to relieve itching. Oatmeal baths to soothe the skin. Medicines, such as over-the-counter antihistamine tablets. Oral steroid medicine, for more severe reactions. Follow these instructions at home: Medicines Take or apply  over-the-counter and prescription medicines only as told by your health care provider. Use hydrocortisone cream or calamine lotion as needed to soothe the skin and relieve itching. General instructions Do not scratch or rub your skin. Apply a cold, wet cloth (cold compress) to the affected areas or take baths in cool water. This will help with itching. Avoid hot baths and showers. Take oatmeal baths as needed. Use colloidal oatmeal. You can get this at your local pharmacy or grocery store. Follow the instructions on the packaging. While you have the rash, wash clothes right after you wear them. Keep all follow-up visits as told by your health care provider. This is important. How is this prevented?  Learn to identify the poison ivy plant and avoid contact with the plant. This plant can be recognized by the number of leaves. Generally, poison ivy has three leaves with flowering branches on a single stem. The leaves are typically glossy, and they have jagged edges that come to a point at the front. If you have been exposed to poison ivy, thoroughly wash with soap and water right away. You have about 30 minutes to remove the plant resin before it will cause the rash. Be sure to wash under your fingernails, because any plant resin there will continue to spread the rash. When hiking or camping, wear clothes that will help you to avoid exposure on the skin. This includes long pants, a long-sleeved shirt, tall socks, and hiking boots. You can also apply preventive lotion to your skin to help limit exposure. If you suspect that your clothes or outdoor gear came in contact with poison ivy, rinse them off outside   with a garden hose before you bring them inside your house. When doing yard work or gardening, wear gloves, long sleeves, long pants, and boots. Wash your garden tools and gloves if they come in contact with poison ivy. If you suspect that your pet has come into contact with poison ivy, wash him or her  with pet shampoo and water. Make sure to wear gloves while washing your pet. Contact a health care provider if you have: Open sores in the rash area. More redness, swelling, or pain in the affected area. Redness that spreads beyond the rash area. Fluid, blood, or pus coming from the affected area. A fever. A rash over a large area of your body. A rash on your eyes, mouth, or genitals. A rash that does not improve after a few weeks. Get help right away if: Your face swells or your eyes swell shut. You have trouble breathing. You have trouble swallowing. These symptoms may represent a serious problem that is an emergency. Do not wait to see if the symptoms will go away. Get medical help right away. Call your local emergency services (911 in the [place] Do not drive yourself to the hospital. Summary Poison ivy dermatitis is inflammation of the skin that is caused by chemicals in the leaves of the poison ivy plant. Symptoms of this condition include redness, itching, a rash, and swelling. Do not scratch or rub your skin. Take or apply over-the-counter and prescription medicines only as told by your health care provider. This information is not intended to replace advice given to you by your health care provider. Make sure you discuss any questions you have with your health care provider. Document Revised: 10/07/2020 Document Reviewed: 10/07/2020 Elsevier Patient Education  2023 [employer]  

## 2021-10-15 ENCOUNTER — Telehealth: Payer: Medicaid Other | Admitting: Physician Assistant

## 2021-10-15 DIAGNOSIS — J329 Chronic sinusitis, unspecified: Secondary | ICD-10-CM

## 2021-10-15 MED ORDER — BENZONATATE 100 MG PO CAPS: 100.0000 mg | ORAL_CAPSULE | Freq: Three times a day (TID) | ORAL | 0 refills | Status: AC

## 2021-10-15 MED ORDER — FLUTICASONE PROPIONATE 50 MCG/ACT NA SUSP: 2.0000 | Freq: Every day | NASAL | 0 refills | Status: DC

## 2021-10-15 MED ORDER — DOXYCYCLINE HYCLATE 100 MG PO CAPS: 100.0000 mg | ORAL_CAPSULE | Freq: Two times a day (BID) | ORAL | 0 refills | Status: AC

## 2021-10-15 NOTE — Addendum Note (Signed)
Addended by: Rodney Booze on: 10/15/2021 01:29 PM   Modules accepted: Orders

## 2021-10-15 NOTE — Progress Notes (Signed)
E-Visit for Sinus Problems  We are sorry that you are not feeling well.  Here is how we plan to help!  Based on what you have shared with me it looks like you have sinusitis.  Sinusitis is inflammation and infection in the sinus cavities of the head.  Based on your presentation I believe you most likely have Acute Bacterial Sinusitis.  This is an infection caused by bacteria and is treated with antibiotics. I have prescribed Augmentin 875mg /125mg  one tablet twice daily with food, for 7 days. You may use an oral decongestant such as Mucinex D or if you have glaucoma or high blood pressure use plain Mucinex. Saline nasal spray help and can safely be used as often as needed for congestion.  If you develop worsening sinus pain, fever or notice severe headache and vision changes, or if symptoms are not better after completion of antibiotic, please schedule an appointment with a health care provider.    I have also prescribed a cough medication called tessalon and fluticasone for your congestion.  Sinus infections are not as easily transmitted as other respiratory infection, however we still recommend that you avoid close contact with loved ones, especially the very young and elderly.  Remember to wash your hands thoroughly throughout the day as this is the number one way to prevent the spread of infection!  Home Care: Only take medications as instructed by your medical team. Complete the entire course of an antibiotic. Do not take these medications with alcohol. A steam or ultrasonic humidifier can help congestion.  You can place a towel over your head and breathe in the steam from hot water coming from a faucet. Avoid close contacts especially the very young and the elderly. Cover your mouth when you cough or sneeze. Always remember to wash your hands.  Get Help Right Away If: You develop worsening fever or sinus pain. You develop a severe head ache or visual changes. Your symptoms persist after  you have completed your treatment plan.  Make sure you Understand these instructions. Will watch your condition. Will get help right away if you are not doing well or get worse.  Thank you for choosing an e-visit.  Your e-visit answers were reviewed by a board certified advanced clinical practitioner to complete your personal care plan. Depending upon the condition, your plan could have included both over the counter or prescription medications.  Please review your pharmacy choice. Make sure the pharmacy is open so you can pick up prescription now. If there is a problem, you may contact your provider through [employer] and have the prescription routed to another pharmacy.  Your safety is important to [place]. If you have drug allergies check your prescription carefully.   For the next 24 hours you can use MyChart to ask questions about today's visit, request a non-urgent call back, or ask for a work or school excuse. You will get an email in the next two days asking about your experience. I hope that your e-visit has been valuable and will speed your recovery.  Approximately 5 minutes was spent documenting and reviewing patient's chart.

## 2022-01-01 ENCOUNTER — Telehealth: Payer: Medicaid Other | Admitting: Emergency Medicine

## 2022-01-01 DIAGNOSIS — T300 Burn of unspecified body region, unspecified degree: Secondary | ICD-10-CM | POA: Diagnosis not present
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
## 2022-02-27 ENCOUNTER — Other Ambulatory Visit: Payer: Self-pay | Admitting: Nurse Practitioner

## 2022-02-27 DIAGNOSIS — F419 Anxiety disorder, unspecified: Secondary | ICD-10-CM

## 2022-03-06 IMAGING — US US PELVIS COMPLETE WITH TRANSVAGINAL
1 series · 15 of 25 positions shown · non-contrast
Comparison: None

CLINICAL DATA: Initial evaluation for menorrhagia with regular
cycle.



[Series 1: us pelvis complete with transvaginal · 15 of 55 slices shown]
[im 1/55]
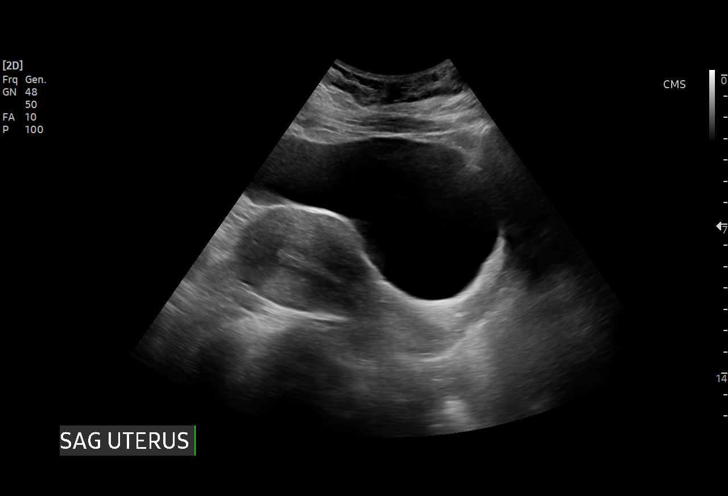
[im 5/55]
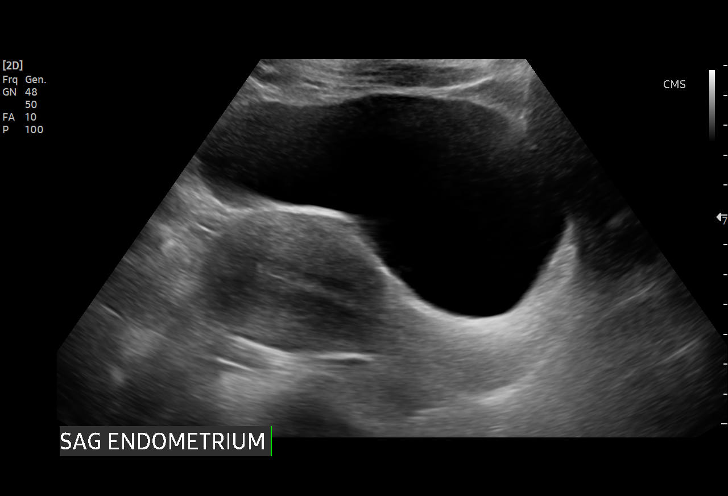
[im 10/55]
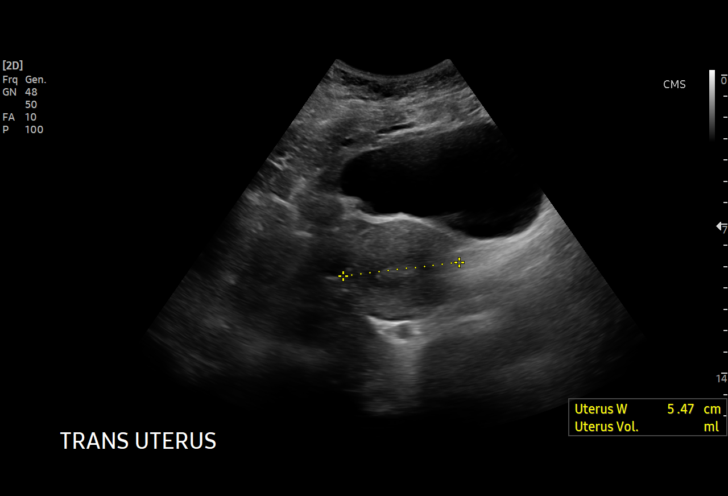
[im 12/55]
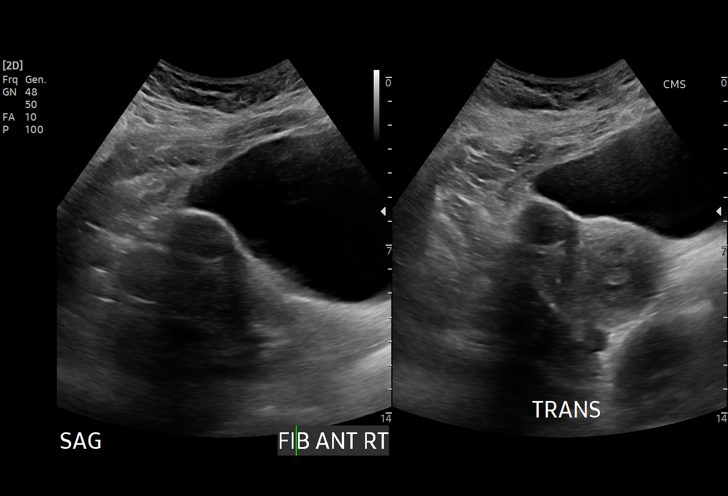
[im 16/55]
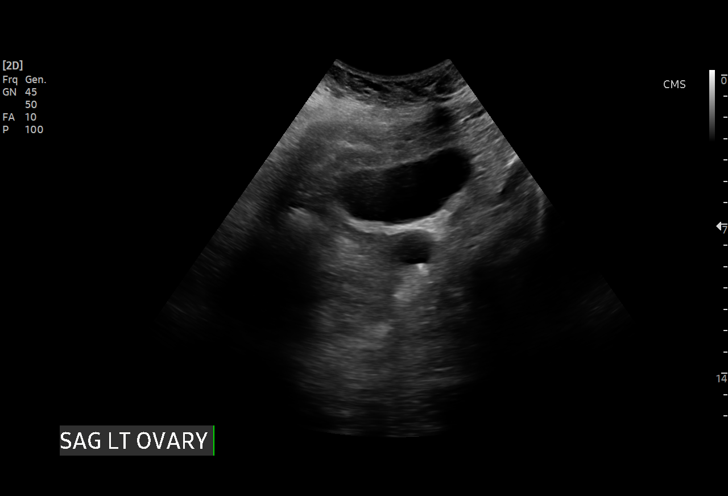
[im 21/55]
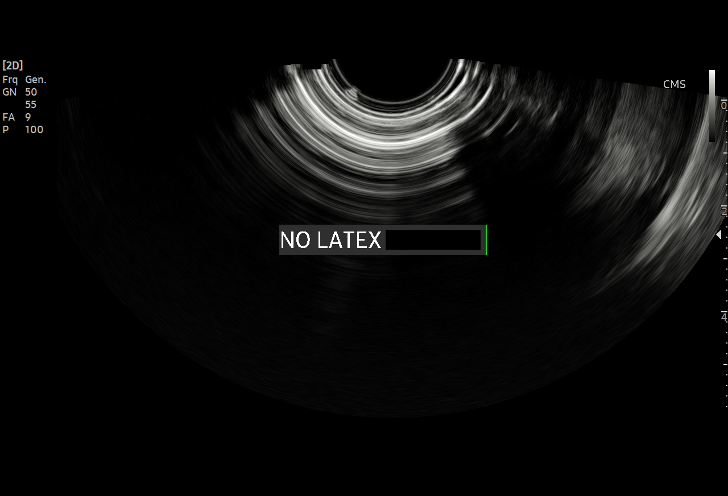
[im 23/55]
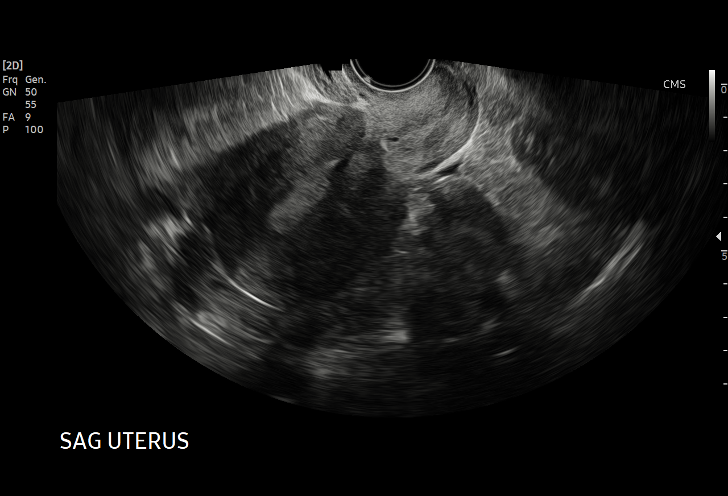
[im 28/55]
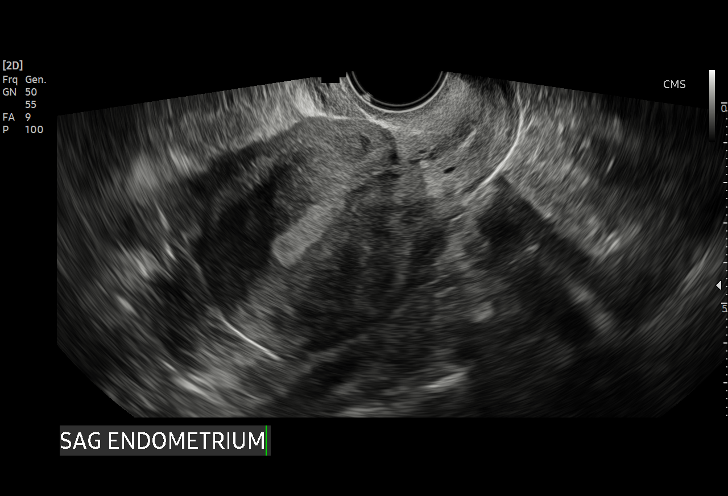
[im 32/55]
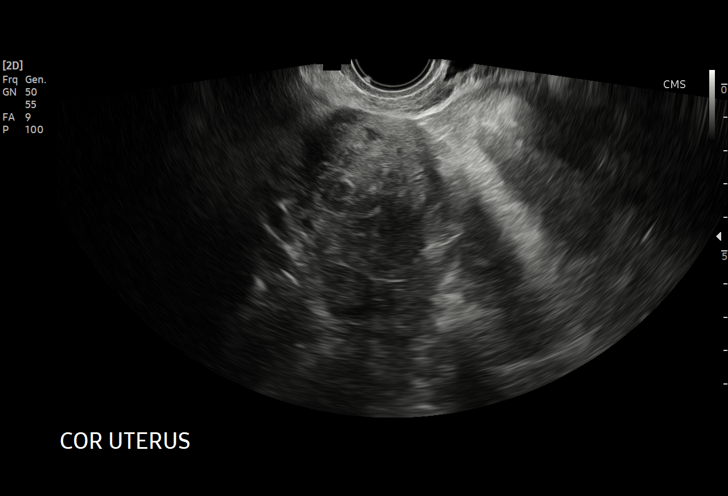
[im 34/55]
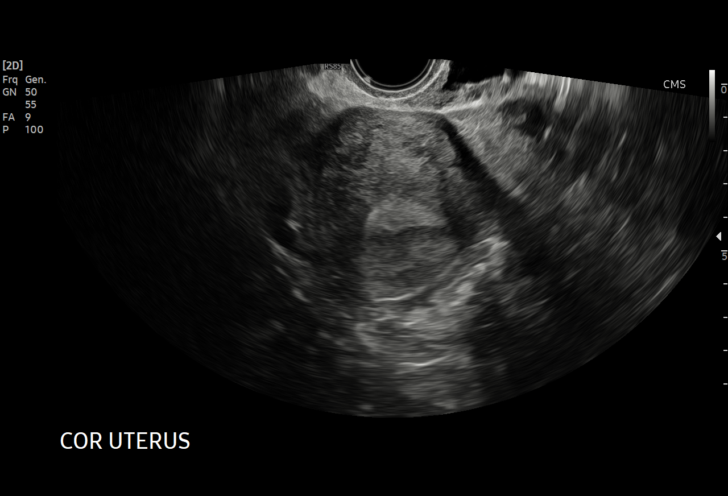
[im 39/55]
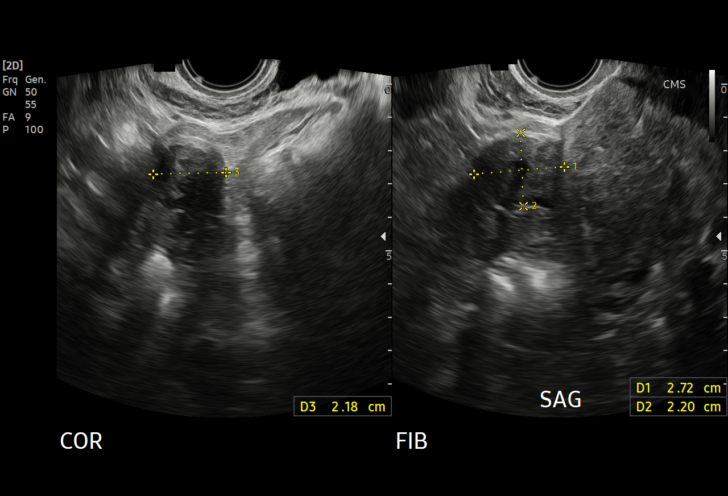
[im 43/55]
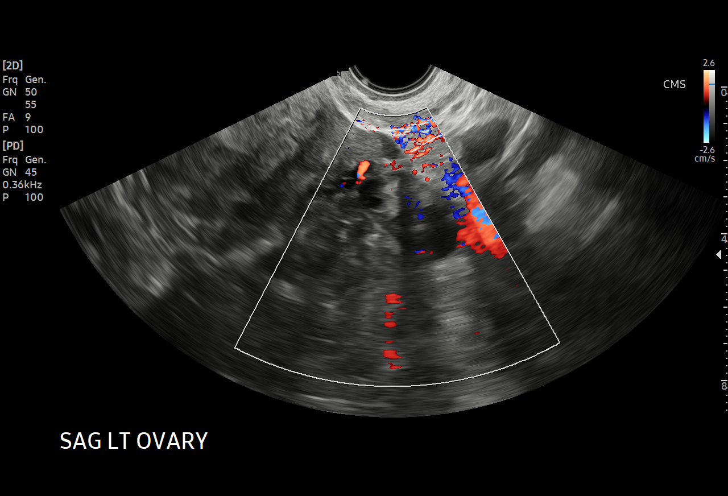
[im 46/55]
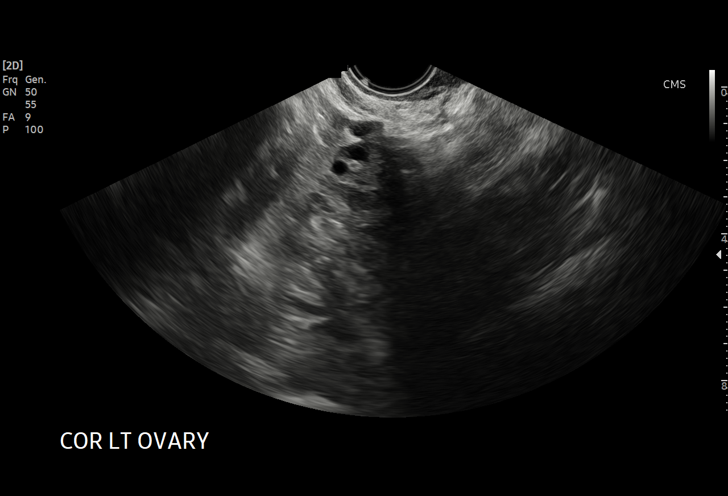
[im 50/55]
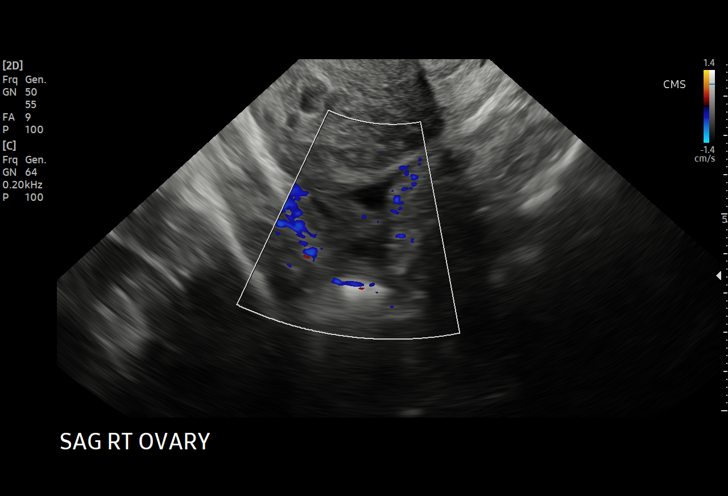
[im 55/55]
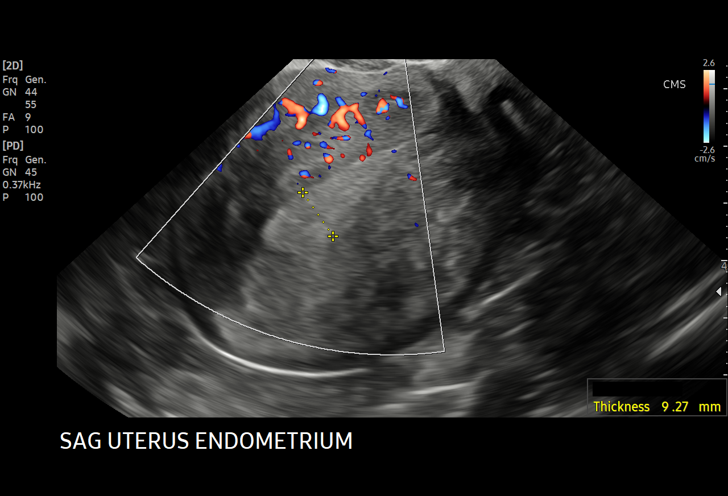

[15 of 25 positions shown; findings below may reference images not displayed]

FINDINGS: Uterus

Measurements: 11.3 x 4.9 x 5.5 cm = volume: 159.1 mL. Uterus is
anteverted. 2.6 x 2.4 x 2.0 cm subserosal fibroid present at the
right anterior uterine fundus.

Endometrium

Thickness: 9.3 mm.  No focal abnormality visualized.

Right ovary

Measurements: 2.9 x 1.8 x 1.6 cm = volume: 4.4 mL. Normal
appearance/no adnexal mass.

Left ovary

Measurements: 3.3 x 2.3 x 3.1 cm = volume: 12.3 mL. Normal
appearance/no adnexal mass.

Other findings

Trace free fluid within the pelvis, presumably physiologic.
IMPRESSION: 1. Endometrial stripe measures 9.3 mm in thickness. If bleeding
remains unresponsive to hormonal or medical therapy, sonohysterogram
should be considered for focal lesion work-up. (Ref: Radiological
Reasoning: Algorithmic Workup of Abnormal Vaginal Bleeding with
Endovaginal Sonography and Sonohysterography. AJR [id number]; [id number]).
2. 2.6 cm right fundal fibroid.
3. Normal sonographic appearance of the ovaries.  No adnexal mass.

## 2022-03-18 ENCOUNTER — Telehealth: Payer: Medicaid Other | Admitting: Physician Assistant

## 2022-03-18 DIAGNOSIS — B9689 Other specified bacterial agents as the cause of diseases classified elsewhere: Secondary | ICD-10-CM

## 2022-03-18 DIAGNOSIS — J019 Acute sinusitis, unspecified: Secondary | ICD-10-CM | POA: Diagnosis not present

## 2022-03-18 MED ORDER — DOXYCYCLINE HYCLATE 100 MG PO TABS: 100.0000 mg | ORAL_TABLET | Freq: Two times a day (BID) | ORAL | 0 refills | Status: DC

## 2022-03-18 NOTE — Patient Instructions (Signed)
Denise Berger, thank you for joining Mar Daring, PA-C for today's virtual visit.  While this provider is not your primary care provider (PCP), if your PCP is located in our provider database this encounter information will be shared with them immediately following your visit.   [facility] account gives you access to today's visit and all your visits, tests, and labs performed at [facility] " click here if you don't have a [facility] account or go to [email or web address]  Consent: (Patient) Denise Berger provided verbal consent for this virtual visit at the beginning of the encounter.  Current Medications:  Current Outpatient Medications:    doxycycline (VIBRA-TABS) 100 MG tablet, Take 1 tablet (100 mg total) by mouth 2 (two) times daily., Disp: 20 tablet, Rfl: 0   citalopram (CELEXA) 20 MG tablet, Take 1 tablet (20 mg total) by mouth daily. (NEEDS TO BE SEEN BEFORE NEXT REFILL), Disp: 30 tablet, Rfl: 0   fluticasone (FLONASE) 50 MCG/ACT nasal spray, Place 2 sprays into both nostrils daily., Disp: 16 g, Rfl: 0   lansoprazole (PREVACID) 30 MG capsule, Take 1 capsule (30 mg total) by mouth daily at 12 noon. (NEEDS TO BE SEEN BEFORE NEXT REFILL), Disp: 30 capsule, Rfl: 0   levonorgestrel (MIRENA) 20 MCG/DAY IUD, 1 each by Intrauterine route once., Disp: , Rfl:    Medications ordered in this encounter:  Meds ordered this encounter  Medications   doxycycline (VIBRA-TABS) 100 MG tablet    Sig: Take 1 tablet (100 mg total) by mouth 2 (two) times daily.    Dispense:  20 tablet    Refill:  0    Order Specific Question:   Supervising Provider    Answer:   Chase Picket [id number]     *If you need refills on other medications prior to your next appointment, please contact your pharmacy*  Follow-Up: Call back or seek an in-person evaluation if the symptoms worsen or if the condition fails to improve as anticipated.  [place] [phone]  Other Instructions  Sinus Infection, Adult A sinus infection, also called sinusitis, is inflammation of your sinuses. Sinuses are hollow spaces in the bones around your face. Your sinuses are located: Around your eyes. In the middle of your forehead. Behind your nose. In your cheekbones. Mucus normally drains out of your sinuses. When your nasal tissues become inflamed or swollen, mucus can become trapped or blocked. This allows bacteria, viruses, and fungi to grow, which leads to infection. Most infections of the sinuses are caused by a virus. A sinus infection can develop quickly. It can last for up to 4 weeks (acute) or for more than 12 weeks (chronic). A sinus infection often develops after a cold. What are the causes? This condition is caused by anything that creates swelling in the sinuses or stops mucus from draining. This includes: Allergies. Asthma. Infection from bacteria or viruses. Deformities or blockages in your nose or sinuses. Abnormal growths in the nose (nasal polyps). Pollutants, such as chemicals or irritants in the air. Infection from fungi. This is rare. What increases the risk? You are more likely to develop this condition if you: Have a weak body defense system (immune system). Do a lot of swimming or diving. Overuse nasal sprays. Smoke. What are the signs or symptoms? The main symptoms of this condition are pain and a feeling of pressure around the affected sinuses. Other symptoms include: Stuffy nose or congestion that makes it difficult to  breathe through your nose. Thick yellow or greenish drainage from your nose. Tenderness, swelling, and warmth over the affected sinuses. A cough that may get worse at night. Decreased sense of smell and taste. Extra mucus that collects in the throat or the back of the nose (postnasal drip) causing a sore throat or bad breath. Tiredness (fatigue). Fever. How is this diagnosed? This condition is diagnosed  based on: Your symptoms. Your medical history. A physical exam. Tests to find out if your condition is acute or chronic. This may include: Checking your nose for nasal polyps. Viewing your sinuses using a device that has a light (endoscope). Testing for allergies or bacteria. Imaging tests, such as an MRI or CT scan. In rare cases, a bone biopsy may be done to rule out more serious types of fungal sinus disease. How is this treated? Treatment for a sinus infection depends on the cause and whether your condition is chronic or acute. If caused by a virus, your symptoms should go away on their own within 10 days. You may be given medicines to relieve symptoms. They include: Medicines that shrink swollen nasal passages (decongestants). A spray that eases inflammation of the nostrils (topical intranasal corticosteroids). Rinses that help get rid of thick mucus in your nose (nasal saline washes). Medicines that treat allergies (antihistamines). Over-the-counter pain relievers. If caused by bacteria, your health care provider may recommend waiting to see if your symptoms improve. Most bacterial infections will get better without antibiotic medicine. You may be given antibiotics if you have: A severe infection. A weak immune system. If caused by narrow nasal passages or nasal polyps, surgery may be needed. Follow these instructions at home: Medicines Take, use, or apply over-the-counter and prescription medicines only as told by your health care provider. These may include nasal sprays. If you were prescribed an antibiotic medicine, take it as told by your health care provider. Do not stop taking the antibiotic even if you start to feel better. Hydrate and humidify  Drink enough fluid to keep your urine pale yellow. Staying hydrated will help to thin your mucus. Use a cool mist humidifier to keep the humidity level in your home above 50%. Inhale steam for 10-15 minutes, 3-4 times a day, or as  told by your health care provider. You can do this in the bathroom while a hot shower is running. Limit your exposure to cool or dry air. Rest Rest as much as possible. Sleep with your head raised (elevated). Make sure you get enough sleep each night. General instructions  Apply a warm, moist washcloth to your face 3-4 times a day or as told by your health care provider. This will help with discomfort. Use nasal saline washes as often as told by your health care provider. Wash your hands often with soap and water to reduce your exposure to germs. If soap and water are not available, use hand sanitizer. Do not smoke. Avoid being around people who are smoking (secondhand smoke). Keep all follow-up visits. This is important. Contact a health care provider if: You have a fever. Your symptoms get worse. Your symptoms do not improve within 10 days. Get help right away if: You have a severe headache. You have persistent vomiting. You have severe pain or swelling around your face or eyes. You have vision problems. You develop confusion. Your neck is stiff. You have trouble breathing. These symptoms may be an emergency. Get help right away. Call 911. Do not wait to see if the symptoms  will go away. Do not drive yourself to the hospital. Summary A sinus infection is soreness and inflammation of your sinuses. Sinuses are hollow spaces in the bones around your face. This condition is caused by nasal tissues that become inflamed or swollen. The swelling traps or blocks the flow of mucus. This allows bacteria, viruses, and fungi to grow, which leads to infection. If you were prescribed an antibiotic medicine, take it as told by your health care provider. Do not stop taking the antibiotic even if you start to feel better. Keep all follow-up visits. This is important. This information is not intended to replace advice given to you by your health care provider. Make sure you discuss any questions you  have with your health care provider. Document Revised: 11/26/2020 Document Reviewed: 11/26/2020 Elsevier Patient Education  [place].    If you have been instructed to have an in-person evaluation today at a local Urgent Care facility, please use the link below. It will take you to a list of all of our available [facility], including address, phone number and hours of operation. Please do not delay care.  [facility]  If you or a family member do not have a primary care provider, use the link below to schedule a visit and establish care. When you choose a [facility] primary care physician or advanced practice provider, you gain a long-term partner in health. Find a Primary Care Provider  Learn more about [facility]'s in-office and virtual care options: [facility]

## 2022-03-18 NOTE — Progress Notes (Signed)
Virtual Visit Consent   Denise Berger, you are scheduled for a virtual visit with a [facility] provider today. Just as with appointments in the office, your consent must be obtained to participate. Your consent will be active for this visit and any virtual visit you may have with one of our providers in the next 365 days. If you have a MyChart account, a copy of this consent can be sent to you electronically.  As this is a virtual visit, video technology does not allow for your provider to perform a traditional examination. This may limit your provider's ability to fully assess your condition. If your provider identifies any concerns that need to be evaluated in person or the need to arrange testing (such as labs, EKG, etc.), we will make arrangements to do so. Although advances in technology are sophisticated, we cannot ensure that it will always work on either your end or our end. If the connection with a video visit is poor, the visit may have to be switched to a telephone visit. With either a video or telephone visit, we are not always able to ensure that we have a secure connection.  By engaging in this virtual visit, you consent to the provision of healthcare and authorize for your insurance to be billed (if applicable) for the services provided during this visit. Depending on your insurance coverage, you may receive a charge related to this service.  I need to obtain your verbal consent now. Are you willing to proceed with your visit today? [name] has provided verbal consent on 03/18/2022 for a virtual visit (video or telephone). Mar Daring, PA-C  Date: 03/18/2022 1:36 PM  Virtual Visit via Video Note   I, Mar Daring, connected with  Denise Berger  ([id number], [birth year]) on 03/18/22 at  1:30 PM EDT by a video-enabled telemedicine application and verified that I am speaking with the correct person using two identifiers.  Location: Patient: Virtual Visit Location Patient:  Home Provider: Virtual Visit Location Provider: Home Office   I discussed the limitations of evaluation and management by telemedicine and the availability of in person appointments. The patient expressed understanding and agreed to proceed.    History of Present Illness: Denise Berger is a 42 y.o. who identifies as a female who was assigned female at birth, and is being seen today for possible sinus infection.  HPI: Sinusitis This is a new problem. The current episode started in the past 7 days (Started Saturday). The problem has been gradually worsening since onset. There has been no fever. Associated symptoms include congestion, coughing (mild), ear pain, headaches, neck pain (pressure), sinus pressure and swollen glands. Pertinent negatives include no chills or sore throat. Treatments tried: tylenol cold and sinus, xyzal, flonase, ibuprofen. The treatment provided no relief.     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 (NEEDS TO BE SEEN BEFORE NEXT REFILL), Disp: 30 tablet, Rfl: 0   fluticasone (FLONASE) 50 MCG/ACT nasal spray, Place 2 sprays into both nostrils daily., Disp: 16 g, Rfl: 0   lansoprazole (PREVACID) 30 MG capsule, Take 1 capsule (30 mg total) by mouth daily at 12 noon. (NEEDS TO  BE SEEN BEFORE NEXT REFILL), Disp: 30 capsule, Rfl: 0   levonorgestrel (MIRENA) 20 MCG/DAY IUD, 1 each by Intrauterine route once., Disp: , Rfl:   Observations/Objective: Patient is well-developed, well-nourished in no acute distress.  Resting comfortably at home.  Head is normocephalic, atraumatic.  No labored breathing.  Speech is clear and coherent with logical content.  Patient is alert and oriented at baseline.    Assessment and Plan: 1. Acute bacterial sinusitis - doxycycline (VIBRA-TABS) 100 MG tablet; Take 1 tablet (100 mg total) by mouth 2 (two) times daily.  Dispense: 20 tablet; Refill: 0  - Worsening symptoms that have not responded to OTC medications.  - Will give Doxycycline - Continue allergy medications.  - Steam and humidifier can help - Stay well hydrated and get plenty of rest.  - Seek in person evaluation if no symptom improvement or if symptoms worsen   Follow Up Instructions: I discussed the assessment and treatment plan with the patient. The patient was provided an opportunity to ask questions and all were answered. The patient agreed with the plan and demonstrated an understanding of the instructions.  A copy of instructions were sent to the patient via MyChart unless otherwise noted below.    The patient was advised to call back or seek an in-person evaluation if the symptoms worsen or if the condition fails to improve as anticipated.  Time:  I spent 8 minutes with the patient via telehealth technology discussing the above problems/concerns.    Mar Daring, PA-C

## 2022-03-24 ENCOUNTER — Encounter: Payer: Self-pay | Admitting: Nurse Practitioner

## 2022-03-24 ENCOUNTER — Ambulatory Visit: Payer: Medicaid Other | Admitting: Nurse Practitioner

## 2022-03-24 VITALS — BP 139/76 | HR 77 | Temp 97.3°F | Resp 20 | Ht 60.0 in | Wt 252.0 lb

## 2022-03-24 DIAGNOSIS — E785 Hyperlipidemia, unspecified: Secondary | ICD-10-CM

## 2022-03-24 DIAGNOSIS — F411 Generalized anxiety disorder: Secondary | ICD-10-CM | POA: Diagnosis not present

## 2022-03-24 DIAGNOSIS — L247 Irritant contact dermatitis due to plants, except food: Secondary | ICD-10-CM | POA: Diagnosis not present

## 2022-03-24 DIAGNOSIS — I10 Essential (primary) hypertension: Secondary | ICD-10-CM

## 2022-03-24 DIAGNOSIS — F419 Anxiety disorder, unspecified: Secondary | ICD-10-CM

## 2022-03-24 DIAGNOSIS — B354 Tinea corporis: Secondary | ICD-10-CM

## 2022-03-24 MED ORDER — TERBINAFINE HCL 1 % EX CREA: 1.0000 | TOPICAL_CREAM | Freq: Two times a day (BID) | CUTANEOUS | 0 refills | Status: DC

## 2022-03-24 MED ORDER — METHYLPREDNISOLONE ACETATE 80 MG/ML IJ SUSP
80.0000 mg | Freq: Once | INTRAMUSCULAR | Status: AC
  Administered 2022-03-24: 80 mg via INTRAMUSCULAR

## 2022-03-24 MED ORDER — CITALOPRAM HYDROBROMIDE 20 MG PO TABS: 20.0000 mg | ORAL_TABLET | Freq: Every day | ORAL | 1 refills | Status: DC

## 2022-03-24 NOTE — Patient Instructions (Signed)
Body Ringworm Body ringworm is an infection of the skin that often causes a ring-shaped rash. Body ringworm is also called tinea corporis. Body ringworm can affect any part of your skin. This condition is easily spread from person to person (is very contagious). What are the causes? This condition is caused by fungi called dermatophytes. The condition develops when these fungi grow out of control on the skin. You can get this condition if you touch a person or animal that has it. You can also get it if you share any items with an infected person or pet. These include: Clothing, bedding, and towels. Brushes or combs. Gym equipment. Any other object that has the fungus on it. What increases the risk? You are more likely to develop this condition if you: Play sports that involve close physical contact, such as wrestling. Sweat a lot. Live in areas that are hot and humid. Use public showers. Have a weakened disease-fighting system (immune system). What are the signs or symptoms? Symptoms of this condition include: Itchy, raised red spots and bumps. Red scaly patches. A ring-shaped rash. The rash may have: A clear center. Scales or red bumps at its center. Redness near its borders. Dry and scaly skin on or around it. How is this diagnosed? This condition can usually be diagnosed with a skin exam. A skin scraping may be taken from the affected area and examined under a microscope to see if the fungus is present. How is this treated? This condition may be treated with: An antifungal cream or ointment. An antifungal shampoo. Antifungal medicines. These may be prescribed if your ringworm: Is severe. Keeps coming back or lasts a long time. Follow these instructions at home: Take over-the-counter and prescription medicines only as told by your health care provider. If you were given an antifungal cream or ointment: Use it as told by your health care provider. Wash the infected area and  dry it completely before applying the cream or ointment. If you were given an antifungal shampoo: Use it as told by your health care provider. Leave the shampoo on your body for 3-5 minutes before rinsing. While you have a rash: Wear loose clothing to stop clothes from rubbing and irritating it. Wash or change your bed sheets every night. Wash clothes and bed sheets in hot water. Disinfect or throw out items that may be infected. Wash your hands often with soap and water for at least 20 seconds. If soap and water are not available, use hand sanitizer. If your pet has the same infection, take your pet to see a veterinarian for treatment. How is this prevented? Take a bath or shower every day and after every time you work out or play sports. Dry your skin completely after bathing. Wear sandals or shoes in public places and showers. Wash athletic clothes after each use. Do not share personal items with others. Avoid touching red patches of skin on other people. Avoid touching pets that have bald spots. If you touch an animal that has a bald spot, wash your hands. Contact a health care provider if: Your rash continues to spread after 7 days of treatment. Your rash is not gone in 4 weeks. The area around your rash gets red, warm, tender, and swollen. This information is not intended to replace advice given to you by your health care provider. Make sure you discuss any questions you have with your health care provider. Document Revised: 06/05/2021 Document Reviewed: 06/05/2021 Elsevier Patient Education  [place].  Contact Dermatitis Dermatitis is redness, soreness, and swelling (inflammation) of the skin. Contact dermatitis is a reaction to certain substances that touch the skin. There are two types of this condition: Irritant contact dermatitis. This is the most common type. It happens when something irritates your skin, such as when your hands get dry from washing them too often  with soap. You can get this type of reaction even if you have not been exposed to the irritant before. Allergic contact dermatitis. This type is caused by a substance that you are allergic to, such as poison ivy. It occurs when you have been exposed to the substance (allergen) and form a sensitivity to it. In some cases, the reaction may start soon after your first exposure to the allergen. In other cases, it may not start until you are exposed to the allergen again. It may then occur every time you are exposed to the allergen in the future. What are the causes? Irritant contact dermatitis is often caused by exposure to: Makeup. Soaps, detergents, and bleaches. Acids. Metal salts, such as nickel. Allergic contact dermatitis is often caused by exposure to: Poisonous plants. Chemicals. Jewelry. Latex. Medicines. Preservatives in products, such as clothes. What increases the risk? You are more likely to get this condition if you have: A job that exposes you to irritants or allergens. Certain medical conditions. These include asthma and eczema. What are the signs or symptoms? Symptoms of this condition may occur in any place on your body that has been touched by the irritant. Symptoms include: Dryness, flaking, or cracking. Redness. Itching. Pain or a burning feeling. Blisters. Drainage of small amounts of blood or clear fluid from skin cracks. With allergic contact dermatitis, there may also be swelling in areas such as the eyelids, mouth, or genitals. How is this diagnosed? This condition is diagnosed with a medical history and physical exam. A patch skin test may be done to help figure out the cause. If the condition is related to your job, you may need to see an expert in health problems in the workplace (occupational medicine specialist). How is this treated? This condition is treated by staying away from the cause of the reaction and protecting your skin from further contact.  Treatment may also include: Steroid creams or ointments. Steroid medicines may need be taken by mouth (orally) in more severe cases. Antibiotics or medicines applied to the skin to kill bacteria (antibacterial ointments). These may be needed if a skin infection is present. Antihistamines. These may be taken orally or put on as a lotion to ease itching. A bandage (dressing). Follow these instructions at home: Skin care Moisturize your skin as needed. Put cool, wet cloths (cool compresses) on the affected areas. Try applying baking soda paste to your skin. Stir water into baking soda until it has the consistency of a paste. Do not scratch your skin. Avoid friction to the affected area. Avoid the use of soaps, perfumes, and dyes. Check the affected areas every day for signs of infection. Check for: More redness, swelling, or pain. More fluid or blood. Warmth. Pus or a bad smell. Medicines Take or apply over-the-counter and prescription medicines only as told by your health care provider. If you were prescribed antibiotics, take or apply them as told by your health care provider. Do not stop using the antibiotic even if you start to feel better. Bathing Try taking a bath with: Epsom salts. Follow the instructions on the packaging. You can get these at your local  pharmacy or grocery store. Baking soda. Pour a small amount into the bath as told by your health care provider. Colloidal oatmeal. Follow the instructions on the packaging. You can get this at your local pharmacy or grocery store. Bathe less often. This may mean bathing every other day. Bathe in lukewarm water. Avoid using hot water. Bandage care If you were given a dressing, change it as told by your health care provider. Wash your hands with soap and water for at least 20 seconds before and after you change your dressing. If soap and water are not available, use hand sanitizer. General instructions Avoid the substance that caused  your reaction. If you do not know what caused it, keep a journal to try to track what caused it. Write down: What you eat and drink. What cosmetics you use. What you wear in the affected area. This includes jewelry. Contact a health care provider if: Your condition does not get better with treatment. Your condition gets worse. You have any signs of infection. You have a fever. You have new symptoms. Your bone or joint under the affected area becomes painful after the skin has healed. Get help right away if: You notice red streaks coming from the affected area. The affected area turns darker. You have trouble breathing. This information is not intended to replace advice given to you by your health care provider. Make sure you discuss any questions you have with your health care provider. Document Revised: 06/27/2021 Document Reviewed: 06/27/2021 Elsevier Patient Education  [place].

## 2022-03-24 NOTE — Progress Notes (Signed)
Subjective:    Chief Complaint: RAsh on right arm, Medical Management of Chronic Issues, and ? ringworm on stomach    HPI:  Denise Berger is a 42 y.o. who identifies as a female who was assigned female at birth.   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 Essential hypertension, benign No chest pain , sob or headaches. Does not check blood pressure at home BP Readings from Last 3 Encounters:  03/24/22 139/76  09/25/21 137/84  08/18/21 (!) 164/102     2. Hyperlipidemia with target LDL less than 100 Doe stry to watch diet. Does no exercise at all. Lab Results  Component Value Date   CHOL 156 06/19/2020   HDL 41 06/19/2020   LDLCALC 94 06/19/2020   TRIG 113 06/19/2020   CHOLHDL 3.8 06/19/2020   The 10-year ASCVD risk score (Arnett DK, et al., 2019) is: 3%   3. GAD (generalized anxiety disorder) Is on celexa and is working GREAT for her    03/24/2022    2:29 PM 02/21/2021    8:31 AM 12/20/2020    8:45 AM 06/19/2020    1:53 PM  GAD 7 : Generalized Anxiety Score  Nervous, Anxious, on Edge 0 1 0 0  Control/stop worrying 0 1 0 0  Worry too much - different things 0 1 0 0  Trouble relaxing 0 1 1 0  Restless 0 0 0 0  Easily annoyed or irritable 0 0 3 0  Afraid - awful might happen 0 0 0 0  Total GAD 7 Score 0 4 4 0  Anxiety Difficulty Not difficult at all Not difficult at all Somewhat difficult Not difficult at all      4. Morbid obesity ([place]) Weight is down 9lbs Wt Readings from Last 3 Encounters:  03/24/22 252 lb (114.3 kg)  09/25/21 263 lb (119.3 kg)  08/18/21 258 lb (117 kg)   BMI Readings from Last 3 Encounters:  03/24/22 49.22 kg/m  09/25/21 51.36 kg/m  08/18/21 50.39 kg/m      New complaints: 2 rashes: - patiet  was out in er garden pulling weeds and developed a rash on her forearms - a round lesion on right upper  abdominal wall.  Allergies  Allergen Reactions   Penicillins Hives   Sulfa Antibiotics Hives   Outpatient Encounter Medications as of 03/24/2022  Medication Sig   citalopram (CELEXA) 20 MG tablet Take 1 tablet (20 mg total) by mouth daily. (NEEDS TO BE SEEN BEFORE NEXT REFILL)   fluticasone (FLONASE) 50 MCG/ACT nasal spray Place 2 sprays into both nostrils daily.   lansoprazole (PREVACID) 30 MG capsule Take 1 capsule (30 mg total) by mouth daily at 12 noon. (NEEDS TO BE SEEN BEFORE NEXT REFILL)   levonorgestrel (MIRENA) 20 MCG/DAY IUD 1 each by Intrauterine route once.   [DISCONTINUED] doxycycline (VIBRA-TABS) 100 MG tablet Take 1 tablet (100 mg total) by mouth 2 (two) times daily.   No facility-administered encounter medications on file as of 03/24/2022.    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   Procedure: OROANTRAL FISTULA REPAIR;  Surgeon: Leta Baptist, MD;  Location: [place];  Service: ENT;  Laterality: Left;   ETHMOIDECTOMY Left 05/05/2019   Procedure: LEFT ETHMOIDECTOMY AND FRONTAL RECESS;  Surgeon: Leta Baptist, MD;  Location: [place];  Service: ENT;  Laterality: Left;   HERNIA REPAIR Right age 14   RLQ incisional hernia repair with mesh placement   laproscopic abd surgery     to remove scar tissue   MAXILLARY ANTROSTOMY Left 05/05/2019   Procedure: LEFT ENDOSCOPIC MAXILLARY ANTROSTOMY WITH TISSUE REMOVAL  ;  Surgeon: Leta Baptist, MD;  Location: [place] [facility];  Service: ENT;  Laterality: Left;   SINUS ENDO WITH FUSION Left 05/05/2019   Procedure: SINUS ENDO WITH FUSION;  Surgeon: Leta Baptist, MD;  Location: [facility];  Service: ENT;  Laterality: Left;    Family History  Problem Relation Age of Onset   Cancer Mother        cervical   Diabetes Father    Fibroids Sister        uterine   Healthy Sister       Controlled substance contract:  n/a     Review of Systems  Constitutional:  Negative for diaphoresis.  Eyes:  Negative for pain.  Respiratory:  Negative for shortness of breath.   Cardiovascular:  Negative for chest pain, palpitations and leg swelling.  Gastrointestinal:  Negative for abdominal pain.  Endocrine: Negative for polydipsia.  Skin:  Negative for rash.  Neurological:  Negative for dizziness, weakness and headaches.  Hematological:  Does not bruise/bleed easily.  All other systems reviewed and are negative.      Objective:   Physical Exam Vitals reviewed.  Constitutional:      Appearance: Normal appearance.  Cardiovascular:     Rate and Rhythm: Normal rate and regular rhythm.     Heart sounds: Normal heart sounds.  Pulmonary:     Effort: Pulmonary effort is normal.     Breath sounds: Normal breath sounds.  Skin:    Comments: Erythematous maculopapular lesions bil forearms. 5cm annual erythematous lesion with central clearing right upper abdominal wall.  Neurological:     General: No focal deficit present.     Mental Status: She is alert.  Psychiatric:        Mood and Affect: Mood normal.        Behavior: Behavior normal.    BP 139/76   Pulse 77   Temp (!) 97.3 F (36.3 C) (Temporal)   Resp 20   Ht 5' (1.524 m)   Wt 252 lb (114.3 kg)   SpO2 97%   BMI 49.22 kg/m         Assessment & Plan:  [name] comes in today with chief complaint of RAsh on right arm, Medical Management of Chronic Issues, and ? ringworm on stomach   Diagnosis and orders addressed:  1. Essential hypertension, benign Low sodium diet - CMP14+EGFR - CBC with Differential/Platelet  2. Hyperlipidemia with target LDL less than 100 Low fat diet - Lipid panel  3. GAD (generalized anxiety disorder) Stress management - citalopram (CELEXA) 20 MG tablet; Take 1 tablet (20 mg total) by mouth daily. (NEEDS TO BE SEEN BEFORE NEXT REFILL)  Dispense: 90 tablet; Refill: 1  4. Morbid obesity ([place]) Discussed diet  and exercise for person with BMI >25 Will recheck weight in 3-6 months   5. Irritant contact dermatitis due to plants, except food Avoid scratching Cool compresses - methylPREDNISolone acetate (DEPO-MEDROL) injection 80 mg  6. Ringworm of body Avoid picking or scratching Good hand washing - terbinafine (LAMISIL AT) 1 % cream; Apply 1 Application topically 2 (two) times daily.  Dispense: 30 g; Refill: 0  Labs pending Health Maintenance reviewed Diet and exercise encouraged  Follow up plan: 6 months and prn   Mary-Margaret Hassell Done, FNP

## 2022-03-25 LAB — CBC WITH DIFFERENTIAL/PLATELET
Basophils Absolute: 0.1 10*3/uL (ref 0.0–0.2)
Basos: 1 %
EOS (ABSOLUTE): 0.1 10*3/uL (ref 0.0–0.4)
Eos: 1 %
Hematocrit: 39.3 % (ref 34.0–46.6)
Hemoglobin: 12.9 g/dL (ref 11.1–15.9)
Immature Grans (Abs): 0.1 10*3/uL (ref 0.0–0.1)
Immature Granulocytes: 1 %
Lymphocytes Absolute: 3 10*3/uL (ref 0.7–3.1)
Lymphs: 26 %
MCH: 27.8 pg (ref 26.6–33.0)
MCHC: 32.8 g/dL (ref 31.5–35.7)
MCV: 85 fL (ref 79–97)
Monocytes Absolute: 0.6 10*3/uL (ref 0.1–0.9)
Monocytes: 5 %
Neutrophils Absolute: 7.9 10*3/uL — ABNORMAL HIGH (ref 1.4–7.0)
Neutrophils: 66 %
Platelets: 411 10*3/uL (ref 150–450)
RBC: 4.64 x10E6/uL (ref 3.77–5.28)
RDW: 13 % (ref 11.7–15.4)
WBC: 11.8 10*3/uL — ABNORMAL HIGH (ref 3.4–10.8)

## 2022-03-25 LAB — CMP14+EGFR
ALT: 24 IU/L (ref 0–32)
AST: 20 IU/L (ref 0–40)
Albumin/Globulin Ratio: 1.6 (ref 1.2–2.2)
Albumin: 4.7 g/dL (ref 3.9–4.9)
Alkaline Phosphatase: 65 IU/L (ref 44–121)
BUN/Creatinine Ratio: 12 (ref 9–23)
BUN: 10 mg/dL (ref 6–24)
Bilirubin Total: <0.2 mg/dL (ref 0.0–1.2)
CO2: 24 mmol/L (ref 20–29)
Calcium: 9.5 mg/dL (ref 8.7–10.2)
Chloride: 99 mmol/L (ref 96–106)
Creatinine, Ser: 0.83 mg/dL (ref 0.57–1.00)
Globulin, Total: 2.9 g/dL (ref 1.5–4.5)
Glucose: 109 mg/dL — ABNORMAL HIGH (ref 70–99)
Potassium: 4.5 mmol/L (ref 3.5–5.2)
Sodium: 138 mmol/L (ref 134–144)
Total Protein: 7.6 g/dL (ref 6.0–8.5)
eGFR: 91 mL/min/{1.73_m2} (ref >59)

## 2022-03-25 LAB — LIPID PANEL
Chol/HDL Ratio: 5.9 ratio — ABNORMAL HIGH (ref 0.0–4.4)
Cholesterol, Total: 206 mg/dL — ABNORMAL HIGH (ref 100–199)
HDL: 35 mg/dL — ABNORMAL LOW (ref >39)
LDL Chol Calc (NIH): 118 mg/dL — ABNORMAL HIGH (ref 0–99)
Triglycerides: 302 mg/dL — ABNORMAL HIGH (ref 0–149)
VLDL Cholesterol Cal: 53 mg/dL — ABNORMAL HIGH (ref 5–40)

## 2022-04-14 ENCOUNTER — Other Ambulatory Visit: Payer: Self-pay | Admitting: Nurse Practitioner

## 2022-09-22 ENCOUNTER — Telehealth: Payer: Medicaid Other | Admitting: Physician Assistant

## 2022-09-22 DIAGNOSIS — J020 Streptococcal pharyngitis: Secondary | ICD-10-CM

## 2022-09-22 MED ORDER — AZITHROMYCIN 250 MG PO TABS
ORAL_TABLET | ORAL | 0 refills | Status: DC
Start: 2022-09-22 — End: 2022-09-24

## 2022-09-22 NOTE — Progress Notes (Signed)
E-Visit for Sore Throat - Strep Symptoms  We are sorry that you are not feeling well.  Here is how we plan to help!  Based on what you have shared with me it is likely that you have strep pharyngitis.  Strep pharyngitis is inflammation and infection in the back of the throat.  This is an infection cause by bacteria and is treated with antibiotics.  I have prescribed Azithromycin 250 mg two tablets today and then one daily for 4 additional days. For throat pain, we recommend over the counter oral pain relief medications such as acetaminophen or aspirin, or anti-inflammatory medications such as ibuprofen or naproxen sodium. Topical treatments such as oral throat lozenges or sprays may be used as needed. Strep infections are not as easily transmitted as other respiratory infections, however we still recommend that you avoid close contact with loved ones, especially the very young and elderly.  Remember to wash your hands thoroughly throughout the day as this is the number one way to prevent the spread of infection and wipe down door knobs and counters with disinfectant.   Home Care: Only take medications as instructed by your medical team. Complete the entire course of an antibiotic. Do not take these medications with alcohol. A steam or ultrasonic humidifier can help congestion.  You can place a towel over your head and breathe in the steam from hot water coming from a faucet. Avoid close contacts especially the very young and the elderly. Cover your mouth when you cough or sneeze. Always remember to wash your hands.  Get Help Right Away If: You develop worsening fever or sinus pain. You develop a severe head ache or visual changes. Your symptoms persist after you have completed your treatment plan.  Make sure you Understand these instructions. Will watch your condition. Will get help right away if you are not doing well or get worse.   Thank you for choosing an e-visit.  Your e-visit  answers were reviewed by a board certified advanced clinical practitioner to complete your personal care plan. Depending upon the condition, your plan could have included both over the counter or prescription medications.  Please review your pharmacy choice. Make sure the pharmacy is open so you can pick up prescription now. If there is a problem, you may contact your provider through [employer] and have the prescription routed to another pharmacy.  Your safety is important to [place]. If you have drug allergies check your prescription carefully.   For the next 24 hours you can use MyChart to ask questions about today's visit, request a non-urgent call back, or ask for a work or school excuse. You will get an email in the next two days asking about your experience. I hope that your e-visit has been valuable and will speed your recovery.

## 2022-09-22 NOTE — Progress Notes (Signed)
I have spent 5 minutes in review of e-visit questionnaire, review and updating patient chart, medical decision making and response to patient.   Mia Milan Cody Jacklynn Dehaas, PA-C    

## 2022-09-24 ENCOUNTER — Ambulatory Visit: Payer: Medicaid Other | Admitting: Nurse Practitioner

## 2022-09-24 ENCOUNTER — Encounter: Payer: Self-pay | Admitting: Nurse Practitioner

## 2022-09-24 VITALS — BP 158/93 | HR 83 | Temp 97.4°F | Resp 20 | Ht 60.0 in | Wt 261.0 lb

## 2022-09-24 DIAGNOSIS — J069 Acute upper respiratory infection, unspecified: Secondary | ICD-10-CM

## 2022-09-24 DIAGNOSIS — F419 Anxiety disorder, unspecified: Secondary | ICD-10-CM | POA: Diagnosis not present

## 2022-09-24 DIAGNOSIS — E785 Hyperlipidemia, unspecified: Secondary | ICD-10-CM

## 2022-09-24 DIAGNOSIS — I10 Essential (primary) hypertension: Secondary | ICD-10-CM

## 2022-09-24 DIAGNOSIS — K219 Gastro-esophageal reflux disease without esophagitis: Secondary | ICD-10-CM | POA: Diagnosis not present

## 2022-09-24 MED ORDER — PROMETHAZINE-DM 6.25-15 MG/5ML PO SYRP: 5.0000 mL | ORAL_SOLUTION | Freq: Four times a day (QID) | ORAL | 0 refills | Status: DC | PRN

## 2022-09-24 MED ORDER — LANSOPRAZOLE 30 MG PO CPDR: 30.0000 mg | DELAYED_RELEASE_CAPSULE | Freq: Two times a day (BID) | ORAL | 5 refills | Status: DC

## 2022-09-24 MED ORDER — CITALOPRAM HYDROBROMIDE 20 MG PO TABS: 20.0000 mg | ORAL_TABLET | Freq: Every day | ORAL | 1 refills | Status: DC

## 2022-09-24 NOTE — Patient Instructions (Signed)

## 2022-09-24 NOTE — Progress Notes (Signed)
Subjective:    Patient ID: Denise Berger, female    DOB: 12-Dec-1980, 42 y.o.   MRN: [id number]   Chief Complaint: Medical Management of Chronic Issues    HPI:  Denise Berger is a 42 y.o. who identifies as a female who was assigned female at birth.   Social history: Lives with: husband Work history: Aeronautical engineer and trucking copany   Comes in today for follow up of the following chronic medical issues:  1. Essential hypertension, benign No c/o chest pain, sob or headache. Does not check blood pressure at home. BP Readings from Last 3 Encounters:  09/24/22 (!) 158/93  03/24/22 139/76  09/25/21 137/84     2. Hyperlipidemia with target LDL less than 100 Does not really watch diet and does no dedicated exercise. Lab Results  Component Value Date   CHOL 206 (H) 03/24/2022   HDL 35 (L) 03/24/2022   LDLCALC 118 (H) 03/24/2022   TRIG 302 (H) 03/24/2022   CHOLHDL 5.9 (H) 03/24/2022     3. Morbid obesity ([facility]) Weight is up 9lbs. Has attempted to get prescription for wegovy or zepbound, but insurance refuses to pay for it. Wt Readings from Last 3 Encounters:  09/24/22 261 lb (118.4 kg)  03/24/22 252 lb (114.3 kg)  09/25/21 263 lb (119.3 kg)   BMI Readings from Last 3 Encounters:  09/24/22 50.97 kg/m  03/24/22 49.22 kg/m  09/25/21 51.36 kg/m      New complaints: None today  Allergies  Allergen Reactions   Penicillins Hives   Sulfa Antibiotics Hives   Outpatient Encounter Medications as of 09/24/2022  Medication Sig   azithromycin (ZITHROMAX) 250 MG tablet Take 2 tablets on day 1, then 1 tablet daily on days 2 through 5   citalopram (CELEXA) 20 MG tablet Take 1 tablet (20 mg total) by mouth daily. (NEEDS TO BE SEEN BEFORE NEXT REFILL)   fluticasone (FLONASE) 50 MCG/ACT nasal spray Place 2 sprays into both nostrils daily.   lansoprazole (PREVACID) 30 MG capsule TAKE ONE CAPSULE ONCE DAILY AT NOON   levonorgestrel (MIRENA) 20 MCG/DAY IUD 1 each by Intrauterine route  once.   [DISCONTINUED] terbinafine (LAMISIL AT) 1 % cream Apply 1 Application topically 2 (two) times daily.   No facility-administered encounter medications on file as of 09/24/2022.    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   Procedure: OROANTRAL FISTULA REPAIR;  Surgeon: Newman Pies, MD;  Location: [facility];  Service: ENT;  Laterality: Left;   ETHMOIDECTOMY Left 05/05/2019   Procedure: LEFT ETHMOIDECTOMY AND FRONTAL RECESS;  Surgeon: Newman Pies, MD;  Location: [facility];  Service: ENT;  Laterality: Left;   HERNIA REPAIR Right age 66   RLQ incisional hernia repair with mesh placement   laproscopic abd surgery     to remove scar tissue   MAXILLARY ANTROSTOMY Left 05/05/2019   Procedure: LEFT ENDOSCOPIC MAXILLARY ANTROSTOMY WITH TISSUE REMOVAL  ;  Surgeon: Newman Pies, MD;  Location: [facility];  Service: ENT;  Laterality: Left;   SINUS ENDO WITH FUSION Left 05/05/2019   Procedure: SINUS ENDO WITH FUSION;  Surgeon: Newman Pies, MD;  Location: [facility];  Service: ENT;  Laterality: Left;    Family History  Problem Relation Age of Onset   Cancer Mother        cervical   Diabetes Father    Fibroids Sister  uterine   Healthy Sister       Controlled substance contract: n/a     Review of Systems  Constitutional:  Negative for diaphoresis.  Eyes:  Negative for pain.  Respiratory:  Negative for shortness of breath.   Cardiovascular:  Negative for chest pain, palpitations and leg swelling.  Gastrointestinal:  Negative for abdominal pain.  Endocrine: Negative for polydipsia.  Skin:  Negative for rash.  Neurological:  Negative for dizziness, weakness and headaches.  Hematological:  Does not bruise/bleed easily.  All other systems reviewed and are negative.      Objective:   Physical Exam Vitals and nursing note reviewed.  Constitutional:       General: She is not in acute distress.    Appearance: Normal appearance. She is well-developed.  HENT:     Head: Normocephalic.     Right Ear: Tympanic membrane normal.     Left Ear: Tympanic membrane normal.     Nose: Congestion and rhinorrhea present.     Mouth/Throat:     Mouth: Mucous membranes are moist.  Eyes:     Pupils: Pupils are equal, round, and reactive to light.  Neck:     Vascular: No carotid bruit or JVD.  Cardiovascular:     Rate and Rhythm: Normal rate and regular rhythm.     Heart sounds: Normal heart sounds.  Pulmonary:     Effort: Pulmonary effort is normal. No respiratory distress.     Breath sounds: Normal breath sounds. No wheezing or rales.     Comments: Dry cough Chest:     Chest wall: No tenderness.  Abdominal:     General: Bowel sounds are normal. There is no distension or abdominal bruit.     Palpations: Abdomen is soft. There is no hepatomegaly, splenomegaly, mass or pulsatile mass.     Tenderness: There is no abdominal tenderness.  Musculoskeletal:        General: Normal range of motion.     Cervical back: Normal range of motion and neck supple.  Lymphadenopathy:     Cervical: No cervical adenopathy.  Skin:    General: Skin is warm and dry.  Neurological:     Mental Status: She is alert and oriented to person, place, and time.     Deep Tendon Reflexes: Reflexes are normal and symmetric.  Psychiatric:        Behavior: Behavior normal.        Thought Content: Thought content normal.        Judgment: Judgment normal.    BP (!) 158/93   Pulse 83   Temp (!) 97.4 F (36.3 C) (Temporal)   Resp 20   Ht 5' (1.524 m)   Wt 261 lb (118.4 kg)   SpO2 99%   BMI 50.97 kg/m         Assessment & Plan:  [name] comes in today with chief complaint of Medical Management of Chronic Issues   Diagnosis and orders addressed:  1. Essential hypertension, benign Low sodium diet - CBC with Differential/Platelet - CMP14+EGFR  2.  Hyperlipidemia with target LDL less than 100 Low fat diet - Lipid panel  3. Anxiety Stress management - citalopram (CELEXA) 20 MG tablet; Take 1 tablet (20 mg total) by mouth daily. (NEEDS TO BE SEEN BEFORE NEXT REFILL)  Dispense: 90 tablet; Refill: 1  4. Gastroesophageal reflux disease without esophagitis Avoid spicy foods Do not eat 2 hours prior to bedtime - lansoprazole (PREVACID) 30 MG capsule; Take 1  capsule (30 mg total) by mouth 2 (two) times daily before a meal.  Dispense: 60 capsule; Refill: 5  5. URI with cough and congestion 1. Take meds as prescribed 2. Use a cool mist humidifier especially during the winter months and when heat has been humid. 3. Use saline nose sprays frequently 4. Saline irrigations of the nose can be very helpful if done frequently.  * 4X daily for 1 week*  * Use of a nettie pot can be helpful with this. Follow directions with this* 5. Drink plenty of fluids 6. Keep thermostat turn down low 7.For any cough or congestion- promethazine DM 8. For fever or aces or pains- take tylenol or ibuprofen appropriate for age and weight.  * for fevers greater than 101 orally you may alternate ibuprofen and tylenol every  3 hours.    - promethazine-dextromethorphan (PROMETHAZINE-DM) 6.25-15 MG/5ML syrup; Take 5 mLs by mouth 4 (four) times daily as needed for cough.  Dispense: 118 mL; Refill: 0  6. Morbid obesity ([facility]) Discussed diet and exercise for person with BMI >25 Will recheck weight in 3-6 months    Labs pending Health Maintenance reviewed Diet and exercise encouraged  Follow up plan: 6 months   [name], FNP

## 2022-09-25 LAB — LIPID PANEL
Chol/HDL Ratio: 5.9 ratio — ABNORMAL HIGH (ref 0.0–4.4)
Cholesterol, Total: 184 mg/dL (ref 100–199)
HDL: 31 mg/dL — ABNORMAL LOW (ref >39)
LDL Chol Calc (NIH): 93 mg/dL (ref 0–99)
Triglycerides: 357 mg/dL — ABNORMAL HIGH (ref 0–149)
VLDL Cholesterol Cal: 60 mg/dL — ABNORMAL HIGH (ref 5–40)

## 2022-09-25 LAB — CMP14+EGFR
ALT: 27 IU/L (ref 0–32)
AST: 24 IU/L (ref 0–40)
Albumin: 4 g/dL (ref 3.9–4.9)
Alkaline Phosphatase: 73 IU/L (ref 44–121)
BUN/Creatinine Ratio: 14 (ref 9–23)
BUN: 8 mg/dL (ref 6–24)
Bilirubin Total: <0.2 mg/dL (ref 0.0–1.2)
CO2: 24 mmol/L (ref 20–29)
Calcium: 8.9 mg/dL (ref 8.7–10.2)
Chloride: 101 mmol/L (ref 96–106)
Creatinine, Ser: 0.58 mg/dL (ref 0.57–1.00)
Globulin, Total: 2.9 g/dL (ref 1.5–4.5)
Glucose: 137 mg/dL — ABNORMAL HIGH (ref 70–99)
Potassium: 4.4 mmol/L (ref 3.5–5.2)
Sodium: 138 mmol/L (ref 134–144)
Total Protein: 6.9 g/dL (ref 6.0–8.5)
eGFR: 117 mL/min/{1.73_m2} (ref >59)

## 2022-09-25 LAB — CBC WITH DIFFERENTIAL/PLATELET
Basophils Absolute: 0.1 10*3/uL (ref 0.0–0.2)
Basos: 1 %
EOS (ABSOLUTE): 0.2 10*3/uL (ref 0.0–0.4)
Eos: 2 %
Hematocrit: 37.3 % (ref 34.0–46.6)
Hemoglobin: 12.2 g/dL (ref 11.1–15.9)
Immature Grans (Abs): 0.1 10*3/uL (ref 0.0–0.1)
Immature Granulocytes: 1 %
Lymphocytes Absolute: 2.3 10*3/uL (ref 0.7–3.1)
Lymphs: 23 %
MCH: 28.9 pg (ref 26.6–33.0)
MCHC: 32.7 g/dL (ref 31.5–35.7)
MCV: 88 fL (ref 79–97)
Monocytes Absolute: 0.5 10*3/uL (ref 0.1–0.9)
Monocytes: 5 %
Neutrophils Absolute: 6.9 10*3/uL (ref 1.4–7.0)
Neutrophils: 68 %
Platelets: 306 10*3/uL (ref 150–450)
RBC: 4.22 x10E6/uL (ref 3.77–5.28)
RDW: 13.1 % (ref 11.7–15.4)
WBC: 10.1 10*3/uL (ref 3.4–10.8)

## 2023-01-04 ENCOUNTER — Telehealth: Payer: Medicaid Other | Admitting: Family Medicine

## 2023-01-04 DIAGNOSIS — J111 Influenza due to unidentified influenza virus with other respiratory manifestations: Secondary | ICD-10-CM

## 2023-01-04 MED ORDER — BENZONATATE 200 MG PO CAPS
200.0000 mg | ORAL_CAPSULE | Freq: Two times a day (BID) | ORAL | 0 refills | Status: DC | PRN
Start: 2023-01-04 — End: 2023-03-29

## 2023-01-04 MED ORDER — OSELTAMIVIR PHOSPHATE 75 MG PO CAPS: 75.0000 mg | ORAL_CAPSULE | Freq: Two times a day (BID) | ORAL | 0 refills | Status: AC

## 2023-01-04 NOTE — Progress Notes (Signed)
E visit for Flu like symptoms   We are sorry that you are not feeling well.  Here is how we plan to help! Based on what you have shared with me it looks like you may have a respiratory virus that may be influenza.  Influenza or "the flu" is   an infection caused by a respiratory virus. The flu virus is highly contagious and persons who did not receive their yearly flu vaccination may "catch" the flu from close contact.  We have anti-viral medications to treat the viruses that cause this infection. They are not a "cure" and only shorten the course of the infection. These prescriptions are most effective when they are given within the first 2 days of "flu" symptoms. Antiviral medication are indicated if you have a high risk of complications from the flu. You should  also consider an antiviral medication if you are in close contact with someone who is at risk. These medications can help patients avoid complications from the flu  but have side effects that you should know. Possible side effects from Tamiflu or oseltamivir include nausea, vomiting, diarrhea, dizziness, headaches, eye redness, sleep problems or other respiratory symptoms. You should not take Tamiflu if you have an allergy to oseltamivir or any to the ingredients in Tamiflu.  Based upon your symptoms and potential risk factors I have prescribed Oseltamivir (Tamiflu).  It has been sent to your designated pharmacy.  You will take one 75 mg capsule orally twice a day for the next 5 days.   I have also sent tessalon perles for cough.   ANYONE WHO HAS FLU SYMPTOMS SHOULD: Stay home. The flu is highly contagious and going out or to work exposes others! Be sure to drink plenty of fluids. Water is fine as well as fruit juices, sodas and electrolyte beverages. You may want to stay away from caffeine or alcohol. If you are nauseated, try taking small sips of liquids. How do you know if you are getting enough fluid? Your urine should be a pale yellow  or almost colorless. Get rest. Taking a steamy shower or using a humidifier may help nasal congestion and ease sore throat pain. Using a saline nasal spray works much the same way. Cough drops, hard candies and sore throat lozenges may ease your cough. Line up a caregiver. Have someone check on you regularly.   GET HELP RIGHT AWAY IF: You cannot keep down liquids or your medications. You become short of breath Your fell like you are going to pass out or loose consciousness. Your symptoms persist after you have completed your treatment plan MAKE SURE YOU  Understand these instructions. Will watch your condition. Will get help right away if you are not doing well or get worse.  Your e-visit answers were reviewed by a board certified advanced clinical practitioner to complete your personal care plan.  Depending on the condition, your plan could have included both over the counter or prescription medications.  If there is a problem please reply  once you have received a response from your provider.  Your safety is important to [place].  If you have drug allergies check your prescription carefully.    You can use MyChart to ask questions about today's visit, request a non-urgent call back, or ask for a work or school excuse for 24 hours related to this e-Visit. If it has been greater than 24 hours you will need to follow up with your provider, or enter a new e-Visit to address  those concerns.  You will get an e-mail in the next two days asking about your experience.  I hope that your e-visit has been valuable and will speed your recovery. Thank you for using e-visits.    have provided 5 minutes of non face to face time during this encounter for chart review and documentation.

## 2023-03-22 ENCOUNTER — Ambulatory Visit: Payer: Medicaid Other | Admitting: Nurse Practitioner

## 2023-03-29 ENCOUNTER — Ambulatory Visit: Admitting: Nurse Practitioner

## 2023-03-29 VITALS — BP 176/109 | HR 82 | Temp 96.7°F | Ht 60.0 in | Wt 262.0 lb

## 2023-03-29 DIAGNOSIS — I10 Essential (primary) hypertension: Secondary | ICD-10-CM | POA: Diagnosis not present

## 2023-03-29 DIAGNOSIS — K219 Gastro-esophageal reflux disease without esophagitis: Secondary | ICD-10-CM | POA: Diagnosis not present

## 2023-03-29 DIAGNOSIS — E785 Hyperlipidemia, unspecified: Secondary | ICD-10-CM | POA: Diagnosis not present

## 2023-03-29 DIAGNOSIS — F419 Anxiety disorder, unspecified: Secondary | ICD-10-CM | POA: Diagnosis not present

## 2023-03-29 DIAGNOSIS — F411 Generalized anxiety disorder: Secondary | ICD-10-CM | POA: Diagnosis not present

## 2023-03-29 MED ORDER — LANSOPRAZOLE 30 MG PO CPDR
30.0000 mg | DELAYED_RELEASE_CAPSULE | Freq: Two times a day (BID) | ORAL | 1 refills | Status: DC
Start: 2023-03-29 — End: 2023-10-20

## 2023-03-29 MED ORDER — CITALOPRAM HYDROBROMIDE 20 MG PO TABS
20.0000 mg | ORAL_TABLET | Freq: Every day | ORAL | 1 refills | Status: DC
Start: 2023-03-29 — End: 2023-11-01

## 2023-03-29 NOTE — Progress Notes (Signed)
 Subjective:    Patient ID: Denise Berger, female    DOB: February 21, 1980, 43 y.o.   MRN: [id number]   Chief Complaint: medical management of chronic issues     HPI:  Denise Berger is a 43 y.o. who identifies as a female who was assigned female at birth.   Social history: Lives with: husband Work history: Aeronautical engineer and trucking copany   Comes in today for follow up of the following chronic medical issues:  1. Essential hypertension, benign No c/o chest pain, sob or headache. Does not check blood pressure at home. BP Readings from Last 3 Encounters:  09/24/22 (!) 158/93  03/24/22 139/76  09/25/21 137/84     2. Hyperlipidemia with target LDL less than 100 Does not really watch diet and does no dedicated exercise. Lab Results  Component Value Date   CHOL 184 09/24/2022   HDL 31 (L) 09/24/2022   LDLCALC 93 09/24/2022   TRIG 357 (H) 09/24/2022   CHOLHDL 5.9 (H) 09/24/2022     3. Morbid obesity (HCC) Weight is up 9lbs. Has attempted to get prescription for wegovy or zepbound, but insurance refuses to pay for it.  Wt Readings from Last 3 Encounters:  03/29/23 262 lb (118.8 kg)  09/24/22 261 lb (118.4 kg)  03/24/22 252 lb (114.3 kg)   BMI Readings from Last 3 Encounters:  03/29/23 51.17 kg/m  09/24/22 50.97 kg/m  03/24/22 49.22 kg/m       New complaints: None today  Allergies  Allergen Reactions   Penicillins Hives   Sulfa Antibiotics Hives   Outpatient Encounter Medications as of 03/29/2023  Medication Sig   benzonatate (TESSALON) 200 MG capsule Take 1 capsule (200 mg total) by mouth 2 (two) times daily as needed for cough.   citalopram (CELEXA) 20 MG tablet Take 1 tablet (20 mg total) by mouth daily. (NEEDS TO BE SEEN BEFORE NEXT REFILL)   fluticasone (FLONASE) 50 MCG/ACT nasal spray Place 2 sprays into both nostrils daily.   lansoprazole (PREVACID) 30 MG capsule Take 1 capsule (30 mg total) by mouth 2 (two) times daily before a meal.   levonorgestrel (MIRENA)  20 MCG/DAY IUD 1 each by Intrauterine route once.   promethazine-dextromethorphan (PROMETHAZINE-DM) 6.25-15 MG/5ML syrup Take 5 mLs by mouth 4 (four) times daily as needed for cough.   No facility-administered encounter medications on file as of 03/29/2023.    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   Procedure: OROANTRAL FISTULA REPAIR;  Surgeon: Denise Pies, MD;  Location: [facility];  Service: ENT;  Laterality: Left;   ETHMOIDECTOMY Left 05/05/2019   Procedure: LEFT ETHMOIDECTOMY AND FRONTAL RECESS;  Surgeon: Denise Pies, MD;  Location: [facility];  Service: ENT;  Laterality: Left;   HERNIA REPAIR Right age 69   RLQ incisional hernia repair with mesh placement   laproscopic abd surgery     to remove scar tissue   MAXILLARY ANTROSTOMY Left 05/05/2019   Procedure: LEFT ENDOSCOPIC MAXILLARY ANTROSTOMY WITH TISSUE REMOVAL  ;  Surgeon: Denise Pies, MD;  Location: [facility];  Service: ENT;  Laterality: Left;   SINUS ENDO WITH FUSION Left 05/05/2019   Procedure: SINUS ENDO WITH FUSION;  Surgeon: Denise Pies, MD;  Location: [facility];  Service: ENT;  Laterality: Left;    Family History  Problem Relation Age of Onset   Cancer Mother  cervical   Diabetes Father    Fibroids Sister        uterine   Healthy Sister       Controlled substance contract: n/a     Review of Systems  Constitutional:  Negative for diaphoresis.  Eyes:  Negative for pain.  Respiratory:  Negative for shortness of breath.   Cardiovascular:  Negative for chest pain, palpitations and leg swelling.  Gastrointestinal:  Negative for abdominal pain.  Endocrine: Negative for polydipsia.  Skin:  Negative for rash.  Neurological:  Negative for dizziness, weakness and headaches.  Hematological:  Does not bruise/bleed easily.  All other systems reviewed and are negative.       Objective:   Physical Exam Vitals and nursing note reviewed.  Constitutional:      General: She is not in acute distress.    Appearance: Normal appearance. She is well-developed.  HENT:     Head: Normocephalic.     Right Ear: Tympanic membrane normal.     Left Ear: Tympanic membrane normal.     Nose: Congestion and rhinorrhea present.     Mouth/Throat:     Mouth: Mucous membranes are moist.  Eyes:     Pupils: Pupils are equal, round, and reactive to light.  Neck:     Vascular: No carotid bruit or JVD.  Cardiovascular:     Rate and Rhythm: Normal rate and regular rhythm.     Heart sounds: Normal heart sounds.  Pulmonary:     Effort: Pulmonary effort is normal. No respiratory distress.     Breath sounds: Normal breath sounds. No wheezing or rales.     Comments: Dry cough Chest:     Chest wall: No tenderness.  Abdominal:     General: Bowel sounds are normal. There is no distension or abdominal bruit.     Palpations: Abdomen is soft. There is no hepatomegaly, splenomegaly, mass or pulsatile mass.     Tenderness: There is no abdominal tenderness.  Musculoskeletal:        General: Normal range of motion.     Cervical back: Normal range of motion and neck supple.  Lymphadenopathy:     Cervical: No cervical adenopathy.  Skin:    General: Skin is warm and dry.  Neurological:     Mental Status: She is alert and oriented to person, place, and time.     Deep Tendon Reflexes: Reflexes are normal and symmetric.  Psychiatric:        Behavior: Behavior normal.        Thought Content: Thought content normal.        Judgment: Judgment normal.    BP (!) 176/109   Pulse 82   Temp (!) 96.7 F (35.9 C) (Temporal)   Ht 5' (1.524 m)   Wt 262 lb (118.8 kg)   SpO2 99%   BMI 51.17 kg/m          Assessment & Plan:  Denise Berger comes in today with chief complaint of No chief complaint on file.   Diagnosis and orders addressed:  1. Essential hypertension, benign Low sodium  diet - CBC with Differential/Platelet - CMP14+EGFR  2. Hyperlipidemia with target LDL less than 100 Low fat diet - Lipid panel  3. Anxiety Stress management - citalopram (CELEXA) 20 MG tablet; Take 1 tablet (20 mg total) by mouth daily. (NEEDS TO BE SEEN BEFORE NEXT REFILL)  Dispense: 90 tablet; Refill: 1  4. Gastroesophageal reflux disease without esophagitis Avoid spicy foods Do not  eat 2 hours prior to bedtime - lansoprazole (PREVACID) 30 MG capsule; Take 1 capsule (30 mg total) by mouth 2 (two) times daily before a meal.  Dispense: 60 capsule; Refill: 5  5. Morbid obesity (HCC) Discussed diet and exercise for person with BMI >25 Will recheck weight in 3-6 months    Labs pending Health Maintenance reviewed Diet and exercise encouraged  Follow up plan: 6 months   [name], FNP

## 2023-03-30 LAB — CMP14+EGFR
ALT: 41 IU/L — ABNORMAL HIGH (ref 0–32)
AST: 36 IU/L (ref 0–40)
Albumin: 4.4 g/dL (ref 3.9–4.9)
Alkaline Phosphatase: 68 IU/L (ref 44–121)
BUN/Creatinine Ratio: 10 (ref 9–23)
BUN: 8 mg/dL (ref 6–24)
Bilirubin Total: <0.2 mg/dL (ref 0.0–1.2)
CO2: 24 mmol/L (ref 20–29)
Calcium: 9.2 mg/dL (ref 8.7–10.2)
Chloride: 99 mmol/L (ref 96–106)
Creatinine, Ser: 0.77 mg/dL (ref 0.57–1.00)
Globulin, Total: 3.1 g/dL (ref 1.5–4.5)
Glucose: 83 mg/dL (ref 70–99)
Potassium: 4.7 mmol/L (ref 3.5–5.2)
Sodium: 136 mmol/L (ref 134–144)
Total Protein: 7.5 g/dL (ref 6.0–8.5)
eGFR: 99 mL/min/{1.73_m2} (ref >59)

## 2023-03-30 LAB — LIPID PANEL
Chol/HDL Ratio: 5.6 ratio — ABNORMAL HIGH (ref 0.0–4.4)
Cholesterol, Total: 206 mg/dL — ABNORMAL HIGH (ref 100–199)
HDL: 37 mg/dL — ABNORMAL LOW (ref >39)
LDL Chol Calc (NIH): 141 mg/dL — ABNORMAL HIGH (ref 0–99)
Triglycerides: 152 mg/dL — ABNORMAL HIGH (ref 0–149)
VLDL Cholesterol Cal: 28 mg/dL (ref 5–40)

## 2023-03-30 LAB — CBC WITH DIFFERENTIAL/PLATELET
Basophils Absolute: 0.1 10*3/uL (ref 0.0–0.2)
Basos: 1 %
EOS (ABSOLUTE): 0.2 10*3/uL (ref 0.0–0.4)
Eos: 1 %
Hematocrit: 39.2 % (ref 34.0–46.6)
Hemoglobin: 13 g/dL (ref 11.1–15.9)
Immature Grans (Abs): 0.1 10*3/uL (ref 0.0–0.1)
Immature Granulocytes: 1 %
Lymphocytes Absolute: 3.2 10*3/uL — ABNORMAL HIGH (ref 0.7–3.1)
Lymphs: 29 %
MCH: 28.3 pg (ref 26.6–33.0)
MCHC: 33.2 g/dL (ref 31.5–35.7)
MCV: 85 fL (ref 79–97)
Monocytes Absolute: 0.6 10*3/uL (ref 0.1–0.9)
Monocytes: 6 %
Neutrophils Absolute: 6.9 10*3/uL (ref 1.4–7.0)
Neutrophils: 62 %
Platelets: 376 10*3/uL (ref 150–450)
RBC: 4.6 x10E6/uL (ref 3.77–5.28)
RDW: 13.3 % (ref 11.7–15.4)
WBC: 11.1 10*3/uL — ABNORMAL HIGH (ref 3.4–10.8)

## 2023-05-11 ENCOUNTER — Telehealth: Admitting: Physician Assistant

## 2023-05-11 DIAGNOSIS — R051 Acute cough: Secondary | ICD-10-CM | POA: Diagnosis not present

## 2023-05-11 DIAGNOSIS — J019 Acute sinusitis, unspecified: Secondary | ICD-10-CM

## 2023-05-11 DIAGNOSIS — B9689 Other specified bacterial agents as the cause of diseases classified elsewhere: Secondary | ICD-10-CM

## 2023-05-11 MED ORDER — BENZONATATE 100 MG PO CAPS: 100.0000 mg | ORAL_CAPSULE | Freq: Three times a day (TID) | ORAL | 0 refills | Status: DC | PRN

## 2023-05-11 MED ORDER — DOXYCYCLINE HYCLATE 100 MG PO TABS: 100.0000 mg | ORAL_TABLET | Freq: Two times a day (BID) | ORAL | 0 refills | Status: DC

## 2023-05-11 NOTE — Progress Notes (Signed)

## 2023-05-21 ENCOUNTER — Ambulatory Visit: Admitting: Nurse Practitioner

## 2023-05-21 ENCOUNTER — Encounter: Payer: Self-pay | Admitting: Nurse Practitioner

## 2023-05-21 VITALS — BP 139/91 | HR 75 | Temp 97.5°F | Ht 60.0 in | Wt 266.0 lb

## 2023-05-21 DIAGNOSIS — M25541 Pain in joints of right hand: Secondary | ICD-10-CM | POA: Diagnosis not present

## 2023-05-21 DIAGNOSIS — M25542 Pain in joints of left hand: Secondary | ICD-10-CM

## 2023-05-21 MED ORDER — IBUPROFEN 800 MG PO TABS: 800.0000 mg | ORAL_TABLET | Freq: Four times a day (QID) | ORAL | 2 refills | Status: DC | PRN

## 2023-05-21 MED ORDER — KETOROLAC TROMETHAMINE 60 MG/2ML IM SOLN
60.0000 mg | Freq: Once | INTRAMUSCULAR | Status: AC
  Administered 2023-05-21: 60 mg via INTRAMUSCULAR

## 2023-05-21 MED ORDER — METHYLPREDNISOLONE ACETATE 80 MG/ML IJ SUSP
80.0000 mg | Freq: Once | INTRAMUSCULAR | Status: AC
  Administered 2023-05-21: 80 mg via INTRAMUSCULAR

## 2023-05-21 NOTE — Patient Instructions (Signed)
 Joint Pain  Joint pain can be caused by many things. It may go away if you follow instructions from your health care provider for taking care of yourself at home. Sometimes, you may need more treatment. Joint pain can be caused by: Bruises at the area of the joint. An injury caused by movements that are repeated. Wear and tear on the joint as you get older. Buildup of uric acid crystals in the joint. This is also called gout. Irritation and swelling of the joint. Types of arthritis. Infections of the joint or of the bone. Your provider may tell you to take pain medicine or wear an elastic bandage, sling, or splint. If your joint pain continues, you may need lab or imaging tests to find the cause of your joint pain. Follow these instructions at home: If you have an elastic bandage, sling, or splint that can be taken off: Wear the bandage, sling, or splint as told by your provider. Take it off only if your provider says you can. Check the skin under and around it every day. Tell your provider if you see problems. Loosen it if your fingers or toes tingle, are numb, or turn cold and blue. Keep it clean and dry. Ask your provider if you should remove it before bathing. If the bandage, sling, or splint is not waterproof: Do not let it get wet. Cover it when you take a bath or shower. Use a cover that does not let any water in. Managing pain, stiffness, and swelling     If told, put ice on the area. If you have an elastic bandage, sling, or splint that you can take off, remove it as told. Put ice in a plastic bag. Place a towel between your skin and the bag. Leave the ice on for 20 minutes, 2-3 times a day. If told, put heat on the area. Do this as often as told. Use the heat source that your provider recommends, such as a moist heat pack or a heating pad. Place a towel between your skin and the heat source. Leave the heat on for 20-30 minutes. If your skin turns bright red, take off the  ice or heat right away to prevent skin damage. The risk of damage is higher if you can't feel pain, heat, or cold. Move your fingers or toes often to reduce stiffness and swelling. Raise the injured area above the level of your heart while you're sitting or lying down. Use a pillow to support the painful area as needed. Activity Rest the painful joint as told. Do not do things that cause pain or make pain worse. Begin exercising or stretching the affected area as told by your provider. Return to normal activities when you are told. Ask what things are safe for you to do. General instructions Take your medicines as told by your provider. Treatment may include medicines for pain and swelling that are taken by mouth or applied to the skin. Do not smoke, vape, or use products with nicotine or tobacco in them. If you need help quitting, talk with your provider. Keep all follow-up visits. Your provider will want to check on your condition. Contact a health care provider if: You have pain that does not get better with medicine. Your joint pain does not improve within 3 days. You have more bruising or swelling. You have a fever. You lose 10 lb (4.5 kg) or more without trying. Get help right away if: You cannot move the joint. Your fingers  or toes tingle, become numb, or turn cold and blue. You have a fever along with a joint that's red, warm, and swollen. This information is not intended to replace advice given to you by your health care provider. Make sure you discuss any questions you have with your health care provider. Document Revised: 09/24/2022 Document Reviewed: 03/06/2022 Elsevier Patient Education  2024 [employer].

## 2023-05-21 NOTE — Progress Notes (Signed)
   Subjective:    Patient ID: Denise Berger, female    DOB: 1980-10-16, 43 y.o.   MRN: [id number]   Chief Complaint: Joints aching all over (Has been going on since April 10)   HPI  Patient comes in c/o joit pain all over that started on April 10. Describes pain as achy. Rates 5/10 currently. Has been taking motrin  every 6 hours. Has swelling of hands and feet. Feels stiff. She is still able to get done what she needs to but is uncomfortable. 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  Eyes:  Negative for pain.  Respiratory:  Negative for shortness of breath.   Cardiovascular:  Negative for chest pain, palpitations and leg swelling.  Gastrointestinal:  Negative for abdominal pain.  Endocrine: Negative for polydipsia.  Musculoskeletal:  Positive for arthralgias (all over) and joint swelling (hands and feet).  Skin:  Negative for rash.  Neurological:  Negative for dizziness, weakness and headaches.  Hematological:  Does not bruise/bleed easily.  All other systems reviewed and are negative.      Objective:   Physical Exam Constitutional:      Appearance: Normal appearance. She is obese.  Cardiovascular:     Rate and Rhythm: Normal rate and regular rhythm.     Heart sounds: Normal heart sounds.  Pulmonary:     Effort: Pulmonary effort is normal.     Breath sounds: Normal breath sounds.  Skin:    General: Skin is warm.  Neurological:     General: No focal deficit present.     Mental Status: She is alert and oriented to  person, place, and time.  Psychiatric:        Mood and Affect: Mood normal.        Behavior: Behavior normal.     BP (!) 139/91   Pulse 75   Temp (!) 97.5 F (36.4 C) (Temporal)   Ht 5' (1.524 m)   Wt 266 lb (120.7 kg)   SpO2 98%   BMI 51.95 kg/m        Assessment & Plan:   [name] in today with chief complaint of Joints aching all over (Has been going on since April 10)   1. Arthralgia of both hands (Primary) Moist heat Rest Motrin   as needed - methylPREDNISolone  acetate (DEPO-MEDROL ) injection 80 mg - ketorolac  (TORADOL ) injection 60 mg - Arthritis Panel - Lyme Disease Serology w/Reflex    The above assessment and management plan was discussed with the patient. The patient verbalized understanding of and has agreed to the management plan. Patient is aware to call the clinic if symptoms persist or worsen. Patient is aware when to return to the clinic for a follow-up visit. Patient educated on when it is appropriate to go to the emergency department.   Mary-Margaret Gaylyn Keas, Denise Berger

## 2023-05-22 LAB — ARTHRITIS PANEL
Basophils Absolute: 0.1 10*3/uL (ref 0.0–0.2)
Basos: 1 %
EOS (ABSOLUTE): 0.2 10*3/uL (ref 0.0–0.4)
Eos: 2 %
Hematocrit: 39.4 % (ref 34.0–46.6)
Hemoglobin: 12.5 g/dL (ref 11.1–15.9)
Immature Grans (Abs): 0.1 10*3/uL (ref 0.0–0.1)
Immature Granulocytes: 1 %
Lymphocytes Absolute: 1.9 10*3/uL (ref 0.7–3.1)
Lymphs: 19 %
MCH: 27.4 pg (ref 26.6–33.0)
MCHC: 31.7 g/dL (ref 31.5–35.7)
MCV: 86 fL (ref 79–97)
Monocytes Absolute: 0.6 10*3/uL (ref 0.1–0.9)
Monocytes: 6 %
Neutrophils Absolute: 7.3 10*3/uL — ABNORMAL HIGH (ref 1.4–7.0)
Neutrophils: 71 %
Platelets: 400 10*3/uL (ref 150–450)
RBC: 4.57 x10E6/uL (ref 3.77–5.28)
RDW: 12.9 % (ref 11.7–15.4)
Rheumatoid fact SerPl-aCnc: <10 [IU]/mL (ref <14.0)
Sed Rate: 29 mm/h (ref 0–32)
Uric Acid: 4.2 mg/dL (ref 2.6–6.2)
WBC: 10.1 10*3/uL (ref 3.4–10.8)

## 2023-05-24 ENCOUNTER — Ambulatory Visit: Payer: Self-pay | Admitting: Nurse Practitioner

## 2023-05-24 LAB — LYME DISEASE SEROLOGY W/REFLEX: Lyme Total Antibody EIA: NEGATIVE

## 2023-06-29 MED ORDER — PANTOPRAZOLE SODIUM 40 MG PO TBEC: 40.0000 mg | DELAYED_RELEASE_TABLET | Freq: Every day | ORAL | 3 refills | Status: DC

## 2023-09-30 ENCOUNTER — Ambulatory Visit: Admitting: Nurse Practitioner

## 2023-09-30 ENCOUNTER — Encounter: Payer: Self-pay | Admitting: Nurse Practitioner

## 2023-09-30 NOTE — Progress Notes (Deleted)
 Subjective:    Patient ID: Denise Berger, female    DOB: April 04, 1980, 43 y.o.   MRN: [id number]   Chief Complaint: medical management of chronic issues     HPI:  Denise Berger is a 43 y.o. who identifies as a female who was assigned female at birth.   Social history: Lives with: husband Work history: Aeronautical engineer and trucking copany   Comes in today for follow up of the following chronic medical issues:  1. Essential hypertension, benign No c/o chest pain, sob or headache. Does not check blood pressure at home. BP Readings from Last 3 Encounters:  05/21/23 (!) 139/91  03/29/23 (!) 176/109  09/24/22 (!) 158/93     2. Hyperlipidemia with target LDL less than 100 Does not really watch diet and does no dedicated exercise. Lab Results  Component Value Date   CHOL 206 (H) 03/29/2023   HDL 37 (L) 03/29/2023   LDLCALC 141 (H) 03/29/2023   TRIG 152 (H) 03/29/2023   CHOLHDL 5.6 (H) 03/29/2023     3. GAD ***   4. Morbid obesity (HCC) Weight is up 9lbs. Has attempted to get prescription for wegovy  or zepbound, but insurance refuses to pay for it.  Wt Readings from Last 3 Encounters:  05/21/23 266 lb (120.7 kg)  03/29/23 262 lb (118.8 kg)  09/24/22 261 lb (118.4 kg)   BMI Readings from Last 3 Encounters:  05/21/23 51.95 kg/m  03/29/23 51.17 kg/m  09/24/22 50.97 kg/m       New complaints: None today  Allergies  Allergen Reactions   Penicillins Hives   Sulfa Antibiotics Hives   Outpatient Encounter Medications as of 09/30/2023  Medication Sig   citalopram  (CELEXA ) 20 MG tablet Take 1 tablet (20 mg total) by mouth daily. (NEEDS TO BE SEEN BEFORE NEXT REFILL)   ibuprofen  (ADVIL ) 800 MG tablet Take 1 tablet (800 mg total) by mouth every 6 (six) hours as needed.   lansoprazole  (PREVACID ) 30 MG capsule Take 1 capsule (30 mg total) by mouth 2 (two) times daily before a meal.   levonorgestrel  (MIRENA ) 20 MCG/DAY IUD 1 each by Intrauterine route once.   pantoprazole   (PROTONIX ) 40 MG tablet Take 1 tablet (40 mg total) by mouth daily.   No facility-administered encounter medications on file as of 09/30/2023.    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   Procedure: OROANTRAL FISTULA REPAIR;  Surgeon: Karis Clunes, MD;  Location: [facility];  Service: ENT;  Laterality: Left;   ETHMOIDECTOMY Left 05/05/2019   Procedure: LEFT ETHMOIDECTOMY AND FRONTAL RECESS;  Surgeon: Karis Clunes, MD;  Location: [facility];  Service: ENT;  Laterality: Left;   HERNIA REPAIR Right age 41   RLQ incisional hernia repair with mesh placement   laproscopic abd surgery     to remove scar tissue   MAXILLARY ANTROSTOMY Left 05/05/2019   Procedure: LEFT ENDOSCOPIC MAXILLARY ANTROSTOMY WITH TISSUE REMOVAL  ;  Surgeon: Karis Clunes, MD;  Location: [facility];  Service: ENT;  Laterality: Left;   SINUS ENDO WITH FUSION Left 05/05/2019   Procedure: SINUS ENDO WITH FUSION;  Surgeon: Karis Clunes, MD;  Location: [facility];  Service: ENT;  Laterality: Left;    Family History  Problem Relation Age of Onset   Cancer Mother        cervical   Diabetes Father    Fibroids Sister  uterine   Healthy Sister       Controlled substance contract: n/a     Review of Systems  Constitutional:  Negative for diaphoresis.  Eyes:  Negative for pain.  Respiratory:  Negative for shortness of breath.   Cardiovascular:  Negative for chest pain, palpitations and leg swelling.  Gastrointestinal:  Negative for abdominal pain.  Endocrine: Negative for polydipsia.  Skin:  Negative for rash.  Neurological:  Negative for dizziness, weakness and headaches.  Hematological:  Does not bruise/bleed easily.  All other systems reviewed and are negative.      Objective:   Physical Exam Vitals and nursing note reviewed.  Constitutional:      General: She is not in acute  distress.    Appearance: Normal appearance. She is well-developed.  HENT:     Head: Normocephalic.     Right Ear: Tympanic membrane normal.     Left Ear: Tympanic membrane normal.     Nose: Congestion and rhinorrhea present.     Mouth/Throat:     Mouth: Mucous membranes are moist.  Eyes:     Pupils: Pupils are equal, round, and reactive to light.  Neck:     Vascular: No carotid bruit or JVD.  Cardiovascular:     Rate and Rhythm: Normal rate and regular rhythm.     Heart sounds: Normal heart sounds.  Pulmonary:     Effort: Pulmonary effort is normal. No respiratory distress.     Breath sounds: Normal breath sounds. No wheezing or rales.     Comments: Dry cough Chest:     Chest wall: No tenderness.  Abdominal:     General: Bowel sounds are normal. There is no distension or abdominal bruit.     Palpations: Abdomen is soft. There is no hepatomegaly, splenomegaly, mass or pulsatile mass.     Tenderness: There is no abdominal tenderness.  Musculoskeletal:        General: Normal range of motion.     Cervical back: Normal range of motion and neck supple.  Lymphadenopathy:     Cervical: No cervical adenopathy.  Skin:    General: Skin is warm and dry.  Neurological:     Mental Status: She is alert and oriented to person, place, and time.     Deep Tendon Reflexes: Reflexes are normal and symmetric.  Psychiatric:        Behavior: Behavior normal.        Thought Content: Thought content normal.        Judgment: Judgment normal.    There were no vitals taken for this visit.         Assessment & Plan:  Tabria Steines comes in today with chief complaint of No chief complaint on file.   Diagnosis and orders addressed:  1. Essential hypertension, benign Low sodium diet - CBC with Differential/Platelet - CMP14+EGFR  2. Hyperlipidemia with target LDL less than 100 Low fat diet - Lipid panel  3. Anxiety Stress management - citalopram  (CELEXA ) 20 MG tablet; Take 1 tablet (20 mg  total) by mouth daily. (NEEDS TO BE SEEN BEFORE NEXT REFILL)  Dispense: 90 tablet; Refill: 1  4. Gastroesophageal reflux disease without esophagitis Avoid spicy foods Do not eat 2 hours prior to bedtime - lansoprazole  (PREVACID ) 30 MG capsule; Take 1 capsule (30 mg total) by mouth 2 (two) times daily before a meal.  Dispense: 60 capsule; Refill: 5  5. Morbid obesity (HCC) Discussed diet and exercise for person with BMI >25 Will  recheck weight in 3-6 months    Labs pending Health Maintenance reviewed Diet and exercise encouraged  Follow up plan: 6 months   Denise Gladis, FNP

## 2023-10-20 ENCOUNTER — Ambulatory Visit: Admitting: Family Medicine

## 2023-10-20 ENCOUNTER — Encounter: Payer: Self-pay | Admitting: Family Medicine

## 2023-10-20 VITALS — BP 161/84 | HR 99 | Temp 97.3°F | Ht 61.0 in | Wt 265.6 lb

## 2023-10-20 DIAGNOSIS — T63441A Toxic effect of venom of bees, accidental (unintentional), initial encounter: Secondary | ICD-10-CM | POA: Diagnosis not present

## 2023-10-20 MED ORDER — PREDNISONE 20 MG PO TABS: 40.0000 mg | ORAL_TABLET | Freq: Every day | ORAL | 0 refills | Status: AC

## 2023-10-20 MED ORDER — FAMOTIDINE 20 MG PO TABS: 20.0000 mg | ORAL_TABLET | Freq: Two times a day (BID) | ORAL | 0 refills | Status: DC

## 2023-10-20 MED ORDER — METHYLPREDNISOLONE ACETATE 80 MG/ML IJ SUSP
80.0000 mg | Freq: Once | INTRAMUSCULAR | Status: AC
  Administered 2023-10-20: 60 mg via INTRAMUSCULAR

## 2023-10-20 NOTE — Addendum Note (Signed)
 Addended by: OLENA RAISIN C on: 10/20/2023 04:10 PM   Modules accepted: Orders

## 2023-10-20 NOTE — Progress Notes (Signed)
 Subjective:  Patient ID: Denise Berger, female    DOB: November 23, 1980, 43 y.o.   MRN: [id number]  Patient Care Team: Gladis Mustard, FNP as PCP - General (Nurse Practitioner) Billee Mliss BIRCH, [facility] as Pharmacist (Family Medicine)   Chief Complaint:  Insect Bite (Bee sting on right thigh x 2 days - red and hot to touch /)   HPI: Denise Berger is a 43 y.o. female presenting on 10/20/2023 for Insect Bite (Bee sting on right thigh x 2 days - red and hot to touch /)   Denise Berger is a 43 year old female who presents with a local reaction to yellow jacket stings.  She was stung by yellow jackets on Monday while at work, specifically on her lower extremities. The reaction was initially extensive, characterized by redness, heat, and swelling. She has been using Benadryl, topical spray, and cream.  She has a history of similar reactions to wasp stings and poison oak/ivy, which have previously required steroid treatment due to significant inflammation. Typically, she needs a steroid shot for such reactions.  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 She recalls being stung on her finger the previous week without a significant reaction, but this time the reaction was more severe.  No trouble breathing, fever, or chills. She identifies the stinging insects as yellow jackets, which were located near the office mailbox.  Current medications include Benadryl.          Relevant past medical, surgical, family, and social history reviewed and updated as indicated.  Allergies and medications reviewed and updated. Data reviewed: Chart in Epic.   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   Procedure: OROANTRAL FISTULA REPAIR;  Surgeon: Karis Clunes, MD;  Location: [facility];  Service: ENT;  Laterality: Left;   ETHMOIDECTOMY Left 05/05/2019   Procedure: LEFT ETHMOIDECTOMY AND FRONTAL RECESS;  Surgeon: Karis Clunes, MD;  Location: [facility];  Service: ENT;  Laterality: Left;   HERNIA REPAIR Right age 69   RLQ incisional hernia repair with mesh placement   laproscopic abd surgery     to remove scar tissue   MAXILLARY ANTROSTOMY Left 05/05/2019   Procedure: LEFT ENDOSCOPIC MAXILLARY ANTROSTOMY WITH TISSUE REMOVAL  ;  Surgeon: Karis Clunes, MD;  Location: [facility];  Service: ENT;  Laterality: Left;   SINUS ENDO WITH FUSION Left 05/05/2019   Procedure: SINUS ENDO WITH FUSION;  Surgeon: Karis Clunes, MD;  Location: [facility];  Service: ENT;  Laterality: Left;    Social History   Socioeconomic History   Marital status: Single    Spouse name: Not on file   Number of children: Not on file   Years of education: Not on file   Highest education level: 12th grade  Occupational History   Not on file  Tobacco Use   Smoking status: Some Days    Current packs/day: 0.00    Types: Cigarettes    Start date: 09/29/2000    Last attempt to quit: 09/30/2002    Years since quitting: 21.0   Smokeless tobacco: Never  Vaping Use   Vaping status: Never Used  Substance and Sexual Activity   Alcohol use: No  Drug use: No   Sexual activity: Not on file  Other Topics Concern   Not on file  Social History Narrative   Not on file   Social Drivers of Health   Financial Resource Strain: Low Risk  (10/19/2023)   Overall Financial Resource Strain (CARDIA)    Difficulty of Paying Living Expenses: Not hard at all  Food Insecurity: No Food Insecurity (10/19/2023)   Hunger Vital Sign    Worried About Running Out of Food in the Last Year: Never true    Ran Out of Food in the Last Year: Never true  Transportation Needs: No Transportation Needs (10/19/2023)   PRAPARE - Administrator, Civil Service (Medical): No     Lack of Transportation (Non-Medical): No  Physical Activity: Inactive (10/19/2023)   Exercise Vital Sign    Days of Exercise per Week: 0 days    Minutes of Exercise per Session: Not on file  Stress: No Stress Concern Present (10/19/2023)   [employer] of Occupational Health - Occupational Stress Questionnaire    Feeling of Stress: Not at all  Social Connections: Moderately Integrated (10/19/2023)   Social Connection and Isolation Panel    Frequency of Communication with Friends and Family: More than three times a week    Frequency of Social Gatherings with Friends and Family: Once a week    Attends Religious Services: More than 4 times per year    Active Member of [employer] or Organizations: No    Attends Banker Meetings: Not on file    Marital Status: Married  Intimate Partner Violence: Not on file    Outpatient Encounter Medications as of 10/20/2023  Medication Sig   citalopram  (CELEXA ) 20 MG tablet Take 1 tablet (20 mg total) by mouth daily. (NEEDS TO BE SEEN BEFORE NEXT REFILL)   famotidine (PEPCID) 20 MG tablet Take 1 tablet (20 mg total) by mouth 2 (two) times daily for 14 days.   levonorgestrel  (MIRENA ) 20 MCG/DAY IUD 1 each by Intrauterine route once.   pantoprazole  (PROTONIX ) 40 MG tablet Take 1 tablet (40 mg total) by mouth daily.   predniSONE  (DELTASONE ) 20 MG tablet Take 2 tablets (40 mg total) by mouth daily with breakfast for 5 days.   [DISCONTINUED] ibuprofen  (ADVIL ) 800 MG tablet Take 1 tablet (800 mg total) by mouth every 6 (six) hours as needed.   [DISCONTINUED] lansoprazole  (PREVACID ) 30 MG capsule Take 1 capsule (30 mg total) by mouth 2 (two) times daily before a meal.   No facility-administered encounter medications on file as of 10/20/2023.    Allergies  Allergen Reactions   Penicillins Hives   Sulfa Antibiotics Hives    Pertinent ROS per HPI, otherwise unremarkable      Objective:  BP (!) 161/84   Pulse 99   Temp (!) 97.3 F (36.3  C)   Ht 5' 1 (1.549 m)   Wt 265 lb 9.6 oz (120.5 kg)   SpO2 97%   BMI 50.18 kg/m    Wt Readings from Last 3 Encounters:  10/20/23 265 lb 9.6 oz (120.5 kg)  05/21/23 266 lb (120.7 kg)  03/29/23 262 lb (118.8 kg)    Physical Exam Vitals and nursing note reviewed.  Constitutional:      General: She is not in acute distress.    Appearance: Normal appearance. She is obese. She is not ill-appearing, toxic-appearing or diaphoretic.  HENT:     Head: Normocephalic and atraumatic.     Nose: Nose normal.  Mouth/Throat:     Mouth: Mucous membranes are moist.  Eyes:     Conjunctiva/sclera: Conjunctivae normal.     Pupils: Pupils are equal, round, and reactive to light.  Cardiovascular:     Rate and Rhythm: Normal rate and regular rhythm.     Heart sounds: Normal heart sounds.  Pulmonary:     Effort: Pulmonary effort is normal. No respiratory distress.     Breath sounds: Normal breath sounds. No stridor. No wheezing, rhonchi or rales.  Chest:     Chest wall: No tenderness.  Musculoskeletal:     Cervical back: Neck supple.     Right lower leg: No edema.     Left lower leg: No edema.  Skin:    General: Skin is warm and dry.     Capillary Refill: Capillary refill takes less than 2 seconds.     Findings: Erythema present.      Neurological:     General: No focal deficit present.     Mental Status: She is alert and oriented to person, place, and time.  Psychiatric:        Mood and Affect: Mood normal.        Behavior: Behavior normal.        Thought Content: Thought content normal.        Judgment: Judgment normal.     Results for orders placed or performed in visit on 05/21/23  Lyme Disease Serology w/Reflex   Collection Time: 05/21/23  9:10 AM  Result Value Ref Range   Lyme Total Antibody EIA Negative Negative  Arthritis Panel   Collection Time: 05/21/23  9:15 AM  Result Value Ref Range   Uric Acid 4.2 2.6 - 6.2 mg/dL   Rheumatoid fact SerPl-aCnc <10.0 <14.0 IU/mL    WBC 10.1 3.4 - 10.8 x10E3/uL   RBC 4.57 3.77 - 5.28 x10E6/uL   Hemoglobin 12.5 11.1 - 15.9 g/dL   Hematocrit 60.5 65.9 - 46.6 %   MCV 86 79 - 97 fL   MCH 27.4 26.6 - 33.0 pg   MCHC 31.7 31.5 - 35.7 g/dL   RDW 87.0 88.2 - 84.5 %   Platelets 400 150 - 450 x10E3/uL   Neutrophils 71 Not Estab. %   Lymphs 19 Not Estab. %   Monocytes 6 Not Estab. %   Eos 2 Not Estab. %   Basos 1 Not Estab. %   Neutrophils Absolute 7.3 (H) 1.4 - 7.0 x10E3/uL   Lymphocytes Absolute 1.9 0.7 - 3.1 x10E3/uL   Monocytes Absolute 0.6 0.1 - 0.9 x10E3/uL   EOS (ABSOLUTE) 0.2 0.0 - 0.4 x10E3/uL   Basophils Absolute 0.1 0.0 - 0.2 x10E3/uL   Immature Granulocytes 1 Not Estab. %   Immature Grans (Abs) 0.1 0.0 - 0.1 x10E3/uL   Sed Rate 29 0 - 32 mm/hr       Pertinent labs & imaging results that were available during my care of the patient were reviewed by me and considered in my medical decision making.  Assessment & Plan:  Denise Berger was seen today for insect bite.  Diagnoses and all orders for this visit:  Bee sting reaction, accidental or unintentional, initial encounter -     predniSONE  (DELTASONE ) 20 MG tablet; Take 2 tablets (40 mg total) by mouth daily with breakfast for 5 days. -     famotidine (PEPCID) 20 MG tablet; Take 1 tablet (20 mg total) by mouth 2 (two) times daily for 14 days.     Acute allergic  reaction to yellow jacket sting Acute allergic reaction to yellow jacket sting with localized redness and inflammation. No systemic symptoms such as difficulty breathing, fever, or chills. Differential diagnosis includes cellulitis due to severity of redness and inflammation. Improvement with Benadryl suggests likely local allergic reaction rather than cellulitis. - Administer steroid injection today. - Prescribe oral prednisone  to start tomorrow. - Advise taking Zyrtec or Xyzal nightly for the next few days. - Prescribe Pepcid, an H2 antagonist, to be taken twice daily for 14 days. - Advise using ice  packs intermittently for itch and swelling relief. - If redness and heat persist by Friday, contact for potential antibiotic treatment. - Follow-up with Ronal Quant on Monday for yearly check-up and reassessment of reaction.          Continue all other maintenance medications.  Follow up plan: Return if symptoms worsen or fail to improve.   Continue healthy lifestyle choices, including diet (rich in fruits, vegetables, and lean proteins, and low in salt and simple carbohydrates) and exercise (at least 30 minutes of moderate physical activity daily).  Educational handout given for bee sting  The above assessment and management plan was discussed with the patient. The patient verbalized understanding of and has agreed to the management plan. Patient is aware to call the clinic if they develop any new symptoms or if symptoms persist or worsen. Patient is aware when to return to the clinic for a follow-up visit. Patient educated on when it is appropriate to go to the emergency department.   Rosaline Bruns, FNP-C [facility] [phone]

## 2023-11-01 ENCOUNTER — Encounter: Payer: Self-pay | Admitting: Nurse Practitioner

## 2023-11-01 ENCOUNTER — Ambulatory Visit: Admitting: Nurse Practitioner

## 2023-11-01 VITALS — BP 138/89 | HR 76 | Temp 97.3°F | Ht 61.0 in | Wt 271.0 lb

## 2023-11-01 DIAGNOSIS — I10 Essential (primary) hypertension: Secondary | ICD-10-CM | POA: Diagnosis not present

## 2023-11-01 DIAGNOSIS — E785 Hyperlipidemia, unspecified: Secondary | ICD-10-CM | POA: Diagnosis not present

## 2023-11-01 DIAGNOSIS — K219 Gastro-esophageal reflux disease without esophagitis: Secondary | ICD-10-CM

## 2023-11-01 DIAGNOSIS — F419 Anxiety disorder, unspecified: Secondary | ICD-10-CM | POA: Diagnosis not present

## 2023-11-01 DIAGNOSIS — T63441A Toxic effect of venom of bees, accidental (unintentional), initial encounter: Secondary | ICD-10-CM

## 2023-11-01 DIAGNOSIS — R739 Hyperglycemia, unspecified: Secondary | ICD-10-CM | POA: Diagnosis not present

## 2023-11-01 LAB — LIPID PANEL

## 2023-11-01 MED ORDER — CITALOPRAM HYDROBROMIDE 20 MG PO TABS: 20.0000 mg | ORAL_TABLET | Freq: Every day | ORAL | 1 refills | Status: AC

## 2023-11-01 MED ORDER — PANTOPRAZOLE SODIUM 40 MG PO TBEC: 40.0000 mg | DELAYED_RELEASE_TABLET | Freq: Every day | ORAL | 1 refills | Status: AC

## 2023-11-01 NOTE — Patient Instructions (Signed)

## 2023-11-01 NOTE — Progress Notes (Signed)
 Subjective:    Patient ID: Denise Berger, female    DOB: 11-30-1980, 43 y.o.   MRN: [id number]   Chief Complaint: medical management of chronic issues     HPI:  Denise Berger is a 43 y.o. who identifies as a female who was assigned female at birth.   Social history: Lives with: husband Work history: aeronautical engineer and trucking copany   Comes in today for follow up of the following chronic medical issues:  1. Essential hypertension, benign No c/o chest pain, sob or headache. Does not check blood pressure at home. BP Readings from Last 3 Encounters:  11/01/23 138/89  10/20/23 (!) 161/84  05/21/23 (!) 139/91     2. Hyperlipidemia with target LDL less than 100 Does not really watch diet and does no dedicated exercise. Lab Results  Component Value Date   CHOL 206 (H) 03/29/2023   HDL 37 (L) 03/29/2023   LDLCALC 141 (H) 03/29/2023   TRIG 152 (H) 03/29/2023   CHOLHDL 5.6 (H) 03/29/2023   The 10-year ASCVD risk score (Arnett DK, et al., 2019) is: 6.2%   3.  Thalia Is on protonix   4. Anxiety Is on celexa  and is doing ok.    11/01/2023    3:36 PM 10/20/2023    3:53 PM 05/21/2023    8:37 AM 03/29/2023    3:22 PM  GAD 7 : Generalized Anxiety Score  Nervous, Anxious, on Edge 0 0 0 0  Control/stop worrying 0 0 0 0  Worry too much - different things 0 0 0 0  Trouble relaxing 0 0 0 0  Restless 0 0 0 0  Easily annoyed or irritable 0 0 0 0  Afraid - awful might happen 0 0 0 0  Total GAD 7 Score 0 0 0 0  Anxiety Difficulty Not difficult at all Not difficult at all Not difficult at all Not difficult at all       11/01/2023    3:36 PM 10/20/2023    3:53 PM 05/21/2023    8:37 AM  Depression screen PHQ 2/9  Decreased Interest 0 0 0  Down, Depressed, Hopeless 0 0 0  PHQ - 2 Score 0 0 0  Altered sleeping  0   Tired, decreased energy  0   Change in appetite  1   Feeling bad or failure about yourself   0   Trouble concentrating  0   Moving slowly or fidgety/restless  0    Suicidal thoughts  0   PHQ-9 Score  1   Difficult doing work/chores  Not difficult at all      5. Morbid obesity (HCC) Weight is up 6lbs. Has attempted to get prescription for wegovy  or zepbound, but insurance refuses to pay for it.  Wt Readings from Last 3 Encounters:  11/01/23 271 lb (122.9 kg)  10/20/23 265 lb 9.6 oz (120.5 kg)  05/21/23 266 lb (120.7 kg)   BMI Readings from Last 3 Encounters:  11/01/23 51.21 kg/m  10/20/23 50.18 kg/m  05/21/23 51.95 kg/m       New complaints: None today  Allergies  Allergen Reactions   Penicillins Hives   Sulfa Antibiotics Hives   Outpatient Encounter Medications as of 11/01/2023  Medication Sig   citalopram  (CELEXA ) 20 MG tablet Take 1 tablet (20 mg total) by mouth daily. (NEEDS TO BE SEEN BEFORE NEXT REFILL)   famotidine (PEPCID) 20 MG tablet Take 1 tablet (20 mg total) by mouth 2 (two) times daily for 14 days.  levonorgestrel  (MIRENA ) 20 MCG/DAY IUD 1 each by Intrauterine route once.   pantoprazole  (PROTONIX ) 40 MG tablet Take 1 tablet (40 mg total) by mouth daily.   No facility-administered encounter medications on file as of 11/01/2023.    Past Surgical History:  Procedure Laterality Date   APPENDECTOMY  age 19   CLOSURE ORAL ANTRAL FISTULA Left 05/05/2019   Procedure: OROANTRAL FISTULA REPAIR;  Surgeon: Karis Clunes, MD;  Location: [facility];  Service: ENT;  Laterality: Left;   ETHMOIDECTOMY Left 05/05/2019   Procedure: LEFT ETHMOIDECTOMY AND FRONTAL RECESS;  Surgeon: Karis Clunes, MD;  Location: [facility];  Service: ENT;  Laterality: Left;   HERNIA REPAIR Right age 43   RLQ incisional hernia repair with mesh placement   laproscopic abd surgery     to remove scar tissue   MAXILLARY ANTROSTOMY Left 05/05/2019   Procedure: LEFT ENDOSCOPIC MAXILLARY ANTROSTOMY WITH TISSUE REMOVAL  ;  Surgeon: Karis Clunes, MD;  Location: [facility];  Service: ENT;  Laterality: Left;   SINUS ENDO WITH  FUSION Left 05/05/2019   Procedure: SINUS ENDO WITH FUSION;  Surgeon: Karis Clunes, MD;  Location: [facility];  Service: ENT;  Laterality: Left;    Family History  Problem Relation Age of Onset   Cancer Mother        cervical   Diabetes Father    Fibroids Sister        uterine   Healthy Sister       Controlled substance contract: n/a     Review of Systems  Constitutional:  Negative for diaphoresis.  Eyes:  Negative for pain.  Respiratory:  Negative for shortness of breath.   Cardiovascular:  Negative for chest pain, palpitations and leg swelling.  Gastrointestinal:  Negative for abdominal pain.  Endocrine: Negative for polydipsia.  Skin:  Negative for rash.  Neurological:  Negative for dizziness, weakness and headaches.  Hematological:  Does not bruise/bleed easily.  All other systems reviewed and are negative.      Objective:   Physical Exam Vitals and nursing note reviewed.  Constitutional:      General: She is not in acute distress.    Appearance: Normal appearance. She is well-developed.  HENT:     Head: Normocephalic.     Right Ear: Tympanic membrane normal.     Left Ear: Tympanic membrane normal.     Nose: Congestion and rhinorrhea present.     Mouth/Throat:     Mouth: Mucous membranes are moist.  Eyes:     Pupils: Pupils are equal, round, and reactive to light.  Neck:     Vascular: No carotid bruit or JVD.  Cardiovascular:     Rate and Rhythm: Normal rate and regular rhythm.     Heart sounds: Normal heart sounds.  Pulmonary:     Effort: Pulmonary effort is normal. No respiratory distress.     Breath sounds: Normal breath sounds. No wheezing or rales.     Comments: Dry cough Chest:     Chest wall: No tenderness.  Abdominal:     General: Bowel sounds are normal. There is no distension or abdominal bruit.     Palpations: Abdomen is soft. There is no hepatomegaly, splenomegaly, mass or pulsatile mass.     Tenderness: There is no abdominal  tenderness.  Musculoskeletal:        General: Normal range of motion.     Cervical back: Normal range of motion and neck supple.  Lymphadenopathy:     Cervical: No cervical adenopathy.  Skin:    General: Skin is warm and dry.  Neurological:     Mental Status: She is alert and oriented to person, place, and time.     Deep Tendon Reflexes: Reflexes are normal and symmetric.  Psychiatric:        Behavior: Behavior normal.        Thought Content: Thought content normal.        Judgment: Judgment normal.    BP 138/89   Pulse 76   Temp (!) 97.3 F (36.3 C) (Temporal)   Ht 5' 1 (1.549 m)   Wt 271 lb (122.9 kg)   SpO2 94%   BMI 51.21 kg/m          Assessment & Plan:  Venice Marcucci comes in today with chief complaint of Medical Management of Chronic Issues   Diagnosis and orders addressed:  1. Essential hypertension, benign Low sodium diet - CBC with Differential/Platelet - CMP14+EGFR  2. Hyperlipidemia with target LDL less than 100 Low fat diet - Lipid panel  3. Anxiety Stress management - citalopram  (CELEXA ) 20 MG tablet; Take 1 tablet (20 mg total) by mouth daily. (NEEDS TO BE SEEN BEFORE NEXT REFILL)  Dispense: 90 tablet; Refill: 1  4. Gastroesophageal reflux disease without esophagitis Avoid spicy foods Do not eat 2 hours prior to bedtime - lansoprazole  (PREVACID ) 30 MG capsule; Take 1 capsule (30 mg total) by mouth 2 (two) times daily before a meal.  Dispense: 60 capsule; Refill: 5  5. Morbid obesity (HCC) Discussed diet and exercise for person with BMI >25 Will recheck weight in 3-6 months    Labs pending Health Maintenance reviewed Diet and exercise encouraged  Follow up plan: 6 months   Mary-Margaret Gladis, FNP

## 2023-11-02 ENCOUNTER — Ambulatory Visit: Payer: Self-pay | Admitting: Nurse Practitioner

## 2023-11-02 DIAGNOSIS — R739 Hyperglycemia, unspecified: Secondary | ICD-10-CM

## 2023-11-02 LAB — CBC WITH DIFFERENTIAL/PLATELET
Basophils Absolute: 0.1 x10E3/uL (ref 0.0–0.2)
Basos: 1 %
EOS (ABSOLUTE): 0.2 x10E3/uL (ref 0.0–0.4)
Eos: 2 %
Hematocrit: 39.8 % (ref 34.0–46.6)
Hemoglobin: 12.7 g/dL (ref 11.1–15.9)
Immature Grans (Abs): 0.1 x10E3/uL (ref 0.0–0.1)
Immature Granulocytes: 1 %
Lymphocytes Absolute: 2.5 x10E3/uL (ref 0.7–3.1)
Lymphs: 21 %
MCH: 28 pg (ref 26.6–33.0)
MCHC: 31.9 g/dL (ref 31.5–35.7)
MCV: 88 fL (ref 79–97)
Monocytes Absolute: 0.6 x10E3/uL (ref 0.1–0.9)
Monocytes: 5 %
Neutrophils Absolute: 8.4 x10E3/uL — ABNORMAL HIGH (ref 1.4–7.0)
Neutrophils: 70 %
Platelets: 354 x10E3/uL (ref 150–450)
RBC: 4.54 x10E6/uL (ref 3.77–5.28)
RDW: 13 % (ref 11.7–15.4)
WBC: 11.8 x10E3/uL — ABNORMAL HIGH (ref 3.4–10.8)

## 2023-11-02 LAB — CMP14+EGFR
ALT: 32 IU/L (ref 0–32)
AST: 29 IU/L (ref 0–40)
Albumin: 4.1 g/dL (ref 3.9–4.9)
Alkaline Phosphatase: 70 IU/L (ref 41–116)
BUN/Creatinine Ratio: 11 (ref 9–23)
BUN: 8 mg/dL (ref 6–24)
Bilirubin Total: 0.2 mg/dL (ref 0.0–1.2)
CO2: 22 mmol/L (ref 20–29)
Calcium: 8.9 mg/dL (ref 8.7–10.2)
Chloride: 97 mmol/L (ref 96–106)
Creatinine, Ser: 0.71 mg/dL (ref 0.57–1.00)
Globulin, Total: 2.8 g/dL (ref 1.5–4.5)
Glucose: 165 mg/dL — AB (ref 70–99)
Potassium: 4.3 mmol/L (ref 3.5–5.2)
Sodium: 134 mmol/L (ref 134–144)
Total Protein: 6.9 g/dL (ref 6.0–8.5)
eGFR: 109 mL/min/1.73 (ref >59)

## 2023-11-02 LAB — LIPID PANEL
Cholesterol, Total: 199 mg/dL (ref 100–199)
HDL: 39 mg/dL — AB (ref >39)
LDL CALC COMMENT:: 5.1 ratio — AB (ref 0.0–4.4)
LDL Chol Calc (NIH): 106 mg/dL — AB (ref 0–99)
Triglycerides: 313 mg/dL — AB (ref 0–149)
VLDL Cholesterol Cal: 54 mg/dL — AB (ref 5–40)

## 2023-11-03 LAB — SPECIMEN STATUS REPORT

## 2023-11-03 LAB — HGB A1C W/O EAG: Hgb A1c MFr Bld: 7.4 % — ABNORMAL HIGH (ref 4.8–5.6)

## 2023-11-23 ENCOUNTER — Other Ambulatory Visit (HOSPITAL_COMMUNITY): Payer: Self-pay

## 2023-11-23 ENCOUNTER — Encounter: Payer: Self-pay | Admitting: Nurse Practitioner

## 2023-11-23 ENCOUNTER — Ambulatory Visit: Admitting: Nurse Practitioner

## 2023-11-23 ENCOUNTER — Telehealth: Payer: Self-pay | Admitting: Pharmacy Technician

## 2023-11-23 VITALS — BP 144/88 | HR 84 | Temp 96.7°F | Ht 61.0 in | Wt 269.0 lb

## 2023-11-23 DIAGNOSIS — E669 Obesity, unspecified: Secondary | ICD-10-CM | POA: Diagnosis not present

## 2023-11-23 DIAGNOSIS — Z7985 Long-term (current) use of injectable non-insulin antidiabetic drugs: Secondary | ICD-10-CM

## 2023-11-23 DIAGNOSIS — I10 Essential (primary) hypertension: Secondary | ICD-10-CM | POA: Diagnosis not present

## 2023-11-23 DIAGNOSIS — E119 Type 2 diabetes mellitus without complications: Secondary | ICD-10-CM | POA: Diagnosis not present

## 2023-11-23 MED ORDER — LANCET DEVICE MISC: 1.0000 | 0 refills | Status: AC

## 2023-11-23 MED ORDER — TIRZEPATIDE 2.5 MG/0.5ML ~~LOC~~ SOAJ: 2.5000 mg | SUBCUTANEOUS | 1 refills | Status: DC

## 2023-11-23 MED ORDER — LANCETS MISC: 1.0000 | 0 refills | Status: AC

## 2023-11-23 MED ORDER — BLOOD GLUCOSE TEST VI STRP: 1.0000 | ORAL_STRIP | 0 refills | Status: AC

## 2023-11-23 MED ORDER — LISINOPRIL 20 MG PO TABS: 20.0000 mg | ORAL_TABLET | Freq: Every day | ORAL | 1 refills | Status: AC

## 2023-11-23 MED ORDER — BLOOD GLUCOSE MONITORING SUPPL DEVI: 1.0000 | 0 refills | Status: AC

## 2023-11-23 NOTE — Telephone Encounter (Signed)
 Please review and advise on metformin

## 2023-11-23 NOTE — Telephone Encounter (Signed)
Please review provider response.

## 2023-11-23 NOTE — Telephone Encounter (Addendum)
 Pharmacy Patient Advocate Encounter   Received notification from Onbase that prior authorization for Mounjaro 2.5MG /0.5ML auto-injectors  is required/requested.   Insurance verification completed.   The patient is insured through [employer]. Key: [id number]  Prior Authorization form/request asks a question that requires your assistance. Please see the question below and advise accordingly. The PA will not be submitted until the necessary information is received.   **Metformin is a step therapy requirement for Medicaid unless the patient has a contraindication or allergy to it. I could not find any documentation about Metformin. Please advise**

## 2023-11-23 NOTE — Progress Notes (Signed)
   Subjective:    Patient ID: Denise Berger, female    DOB: 1980-05-25, 43 y.o.   MRN: [id number]   Chief Complaint: new onset diabetes  HPI  Patient was seen on 11/01/23 for chronic follow up. Her labs showed elevated blood sugar.HGBA1c was added and was 7.4. SHe is here today to discuss medication options.   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  Eyes:  Negative for pain.  Respiratory:  Negative for shortness of breath.   Cardiovascular:  Negative for palpitations and leg swelling.  Gastrointestinal:  Negative for abdominal pain.  Skin:  Negative for rash.  Hematological:  Does not bruise/bleed easily.  All other systems reviewed and are negative.      Objective:   Physical Exam Constitutional:      Appearance: Normal appearance. She is obese.  Cardiovascular:     Rate and Rhythm: Normal rate and regular rhythm.     Heart sounds: Normal heart sounds.  Pulmonary:     Breath sounds: Normal breath sounds.  Neurological:     General: No focal deficit present.     Mental Status: She is alert and oriented to person, place, and time.  Psychiatric:        Mood and Affect: Mood normal.        Behavior: Behavior normal.    BP (!) 144/88   Pulse 84   Temp (!) 96.7 F (35.9 C) (Temporal)   Ht 5' 1 (1.549 m)   Wt 269 lb (122 kg)   SpO2 99%   BMI 50.83 kg/m          Assessment & Plan:   Denise Berger in  today with chief complaint of Diabetes   1. Type 2 diabetes mellitus in patient with obesity (HCC) (Primary) Carb counting - tirzepatide (MOUNJARO) 2.5 MG/0.5ML Pen; Inject 2.5 mg into the skin once a week.  Dispense: 2 mL; Refill: 1  2. Primary hypertension Dash diet - lisinopril  (ZESTRIL ) 20 MG tablet; Take 1 tablet (20 mg total) by mouth daily.  Dispense: 90 tablet; Refill: 1    The above assessment and management plan was discussed with the patient. The patient verbalized understanding of and has agreed to the management plan. Patient is aware to call the clinic if symptoms persist or worsen. Patient is aware when to return to the clinic for a follow-up visit. Patient educated on when it is appropriate to go to the emergency department.   Mary-Margaret Gladis, FNP

## 2023-11-23 NOTE — Patient Instructions (Signed)
 DASH Eating Plan DASH stands for Dietary Approaches to Stop Hypertension. The DASH eating plan is a healthy eating plan that has been shown to: Lower high blood pressure (hypertension). Reduce your risk for type 2 diabetes, heart disease, and stroke. Help with weight loss. What are tips for following this plan? Reading food labels Check food labels for the amount of salt (sodium) per serving. Choose foods with less than 5 percent of the Daily Value (DV) of sodium. In general, foods with less than 300 milligrams (mg) of sodium per serving fit into this eating plan. To find whole grains, look for the word whole as the first word in the ingredient list. Shopping Buy products labeled as low-sodium or no salt added. Buy fresh foods. Avoid canned foods and pre-made or frozen meals. Cooking Try not to add salt when you cook. Use salt-free seasonings or herbs instead of table salt or sea salt. Check with your health care provider or pharmacist before using salt substitutes. Do not fry foods. Cook foods in healthy ways, such as baking, boiling, grilling, roasting, or broiling. Cook using oils that are good for your heart. These include olive, canola, avocado, soybean, and sunflower oil. Meal planning  Eat a balanced diet. This should include: 4 or more servings of fruits and 4 or more servings of vegetables each day. Try to fill half of your plate with fruits and vegetables. 6-8 servings of whole grains each day. 6 or less servings of lean meat, poultry, or fish each day. 1 oz is 1 serving. A 3 oz (85 g) serving of meat is about the same size as the palm of your hand. One egg is 1 oz (28 g). 2-3 servings of low-fat dairy each day. One serving is 1 cup (237 mL). 1 serving of nuts, seeds, or beans 5 times each week. 2-3 servings of heart-healthy fats. Healthy fats called omega-3 fatty acids are found in foods such as walnuts, flaxseeds, fortified milks, and eggs. These fats are also found in  cold-water fish, such as sardines, salmon, and mackerel. Limit how much you eat of: Canned or prepackaged foods. Food that is high in trans fat, such as fried foods. Food that is high in saturated fat, such as fatty meat. Desserts and other sweets, sugary drinks, and other foods with added sugar. Full-fat dairy products. Do not salt foods before eating. Do not eat more than 4 egg yolks a week. Try to eat at least 2 vegetarian meals a week. Eat more home-cooked food and less restaurant, buffet, and fast food. Lifestyle When eating at a restaurant, ask if your food can be made with less salt or no salt. If you drink alcohol: Limit how much you have to: 0-1 drink a day if you are female. 0-2 drinks a day if you are female. Know how much alcohol is in your drink. In the [place] one drink is one 12 oz bottle of beer (355 mL), one 5 oz glass of wine (148 mL), or one 1 oz glass of hard liquor (44 mL). General information Avoid eating more than 2,300 mg of salt a day. If you have hypertension, you may need to reduce your sodium intake to 1,500 mg a day. Work with your provider to stay at a healthy body weight or lose weight. Ask what the best weight range is for you. On most days of the week, get at least 30 minutes of exercise that causes your heart to beat faster. This may include walking, swimming, or  biking. Work with your provider or dietitian to adjust your eating plan to meet your specific calorie needs. What foods should I eat? Fruits All fresh, dried, or frozen fruit. Canned fruits that are in their natural juice and do not have sugar added to them. Vegetables Fresh or frozen vegetables that are raw, steamed, roasted, or grilled. Low-sodium or reduced-sodium tomato and vegetable juice. Low-sodium or reduced-sodium tomato sauce and tomato paste. Low-sodium or reduced-sodium canned vegetables. Grains Whole-grain or whole-wheat bread. Whole-grain or whole-wheat pasta. Brown rice. [place]. Bulgur. Whole-grain and low-sodium cereals. Pita bread. Low-fat, low-sodium crackers. Whole-wheat flour tortillas. Meats and other proteins Skinless chicken or [place]. Ground chicken or [place]. Pork with fat trimmed off. Fish and seafood. Egg whites. Dried beans, peas, or lentils. Unsalted nuts, nut butters, and seeds. Unsalted canned beans. Lean cuts of beef with fat trimmed off. Low-sodium, lean precooked or cured meat, such as sausages or meat loaves. Dairy Low-fat (1%) or fat-free (skim) milk. Reduced-fat, low-fat, or fat-free cheeses. Nonfat, low-sodium ricotta or cottage cheese. Low-fat or nonfat yogurt. Low-fat, low-sodium cheese. Fats and oils Soft margarine without trans fats. Vegetable oil. Reduced-fat, low-fat, or light mayonnaise and salad dressings (reduced-sodium). Canola, safflower, olive, avocado, soybean, and sunflower oils. Avocado. Seasonings and condiments Herbs. Spices. Seasoning mixes without salt. Other foods Unsalted popcorn and pretzels. Fat-free sweets. The items listed above may not be all the foods and drinks you can have. Talk to a dietitian to learn more. What foods should I avoid? Fruits Canned fruit in a light or heavy syrup. Fried fruit. Fruit in cream or butter sauce. Vegetables Creamed or fried vegetables. Vegetables in a cheese sauce. Regular canned vegetables that are not marked as low-sodium or reduced-sodium. Regular canned tomato sauce and paste that are not marked as low-sodium or reduced-sodium. Regular tomato and vegetable juices that are not marked as low-sodium or reduced-sodium. Dene. Olives. Grains Baked goods made with fat, such as croissants, muffins, or some breads. Dry pasta or rice meal packs. Meats and other proteins Fatty cuts of meat. Ribs. Fried meat. [place]. Bologna, salami, and other precooked or cured meats, such as sausages or meat loaves, that are not lean and low in sodium. Fat from the back of a pig (fatback). Bratwurst.  Salted nuts and seeds. Canned beans with added salt. Canned or smoked fish. Whole eggs or egg yolks. Chicken or [place] with skin. Dairy Whole or 2% milk, cream, and half-and-half. Whole or full-fat cream cheese. Whole-fat or sweetened yogurt. Full-fat cheese. Nondairy creamers. Whipped toppings. Processed cheese and cheese spreads. Fats and oils Butter. Stick margarine. Lard. Shortening. Ghee. Bacon fat. Tropical oils, such as coconut, palm kernel, or palm oil. Seasonings and condiments Onion salt, garlic salt, seasoned salt, table salt, and sea salt. Worcestershire sauce. Tartar sauce. Barbecue sauce. Teriyaki sauce. Soy sauce, including reduced-sodium soy sauce. Steak sauce. Canned and packaged gravies. Fish sauce. Oyster sauce. Cocktail sauce. Store-bought horseradish. Ketchup. Mustard. Meat flavorings and tenderizers. Bouillon cubes. Hot sauces. Pre-made or packaged marinades. Pre-made or packaged taco seasonings. Relishes. Regular salad dressings. Other foods Salted popcorn and pretzels. The items listed above may not be all the foods and drinks you should avoid. Talk to a dietitian to learn more. Where to find more information National Heart, Lung, and Blood Institute (NHLBI): BuffaloDryCleaner.gl American Heart Association (AHA): heart.org Academy of Nutrition and Dietetics: eatright.org National Kidney Foundation (NKF): kidney.org This information is not intended to replace advice given to you by your health care provider. Make sure  you discuss any questions you have with your health care provider. Document Revised: 01/08/2022 Document Reviewed: 01/08/2022 Elsevier Patient Education  2024 [employer]Diabetes: Carbohydrate Counting for Adults Carbohydrate counting is a method of keeping track of how many carbohydrates you eat. Eating carbohydrates increases the amount of sugar, also called glucose, in your blood. By counting how many carbohydrates you eat, you can improve how well you manage  your blood sugar. This, in turn, helps you manage your diabetes. Carbohydrates are measured in grams (g) per serving. It's important to know how many carbohydrates (in grams or by serving size) you can have in each meal. This is different for every person. A dietitian can help you make a meal plan and calculate how many carbohydrates you should have at each meal and snack. What foods contain carbohydrates? Carbohydrates are found in these foods: Grains, such as breads and cereals. Dried beans and soy products. Starchy vegetables, such as potatoes, peas, and corn. Fruit and fruit juices. Milk and yogurt. Sweets and snack foods like cake, cookies, candy, chips, and soft drinks. How do I count carbohydrates in foods? There are two ways to count carbohydrates in food. You can read food labels or learn standard serving sizes of foods. You can use either of these methods or a combination of both. Using the Nutrition Facts label The Nutrition Facts list is included on the labels of almost all packaged foods and drinks in the [place] It includes: The serving size. Information about nutrients in each serving. This includes the grams of carbohydrate per serving. To use the Nutrition Facts, decide how many servings you will have. Then, multiply the number of servings by the number of carbohydrates per serving. The resulting number is the total grams of carbohydrates that you'll be having. Learning the standard serving sizes of foods When you eat carbohydrate foods that aren't packaged or don't include Nutrition Facts on the label, you need to measure the servings in order to count the grams of carbohydrates. Measure the foods that you'll eat with a food scale or measuring cup, if needed. Decide how many standard-size servings you'll eat. Multiply the number of servings by 15. For foods that contain carbohydrates, one serving equals 15 g of carbohydrates. For example, if you eat 2 cups or 10 oz (300 g) of  strawberries, you'll have eaten 2 servings and 30 g of carbohydrates (2 servings x 15 g = 30 g). For foods that have more than one food mixed, such as soups and casseroles, you must count the carbohydrates in each food that's included. Here's a list of standard serving sizes for common carbohydrate-rich foods. Each of these servings has about 15 g of carbohydrates: 1 slice of bread. 1 six-inch (15 cm) tortilla. ? cup or 2 oz (53 g) of cooked rice or pasta.  cup or 3 oz (85 g) of cooked or canned, drained, and rinsed beans or lentils.  cup or 3 oz (85 g) of a starchy vegetable, such as peas, corn, or squash.  cup or 4 oz (120 g) of hot cereal.  cup or 3 oz (85 g) of boiled or mashed potatoes, or  or 3 oz (85 g) of a large baked potato.  cup or 4 fl oz (118 mL) of fruit juice. 1 cup or 8 fl oz (237 mL) of milk. 1 small or 4 oz (106 g) apple.  or 2 oz (63 g) of a medium banana. 1 cup or 5 oz (150 g) of strawberries. 3 cups  or 1 oz (28.3 g) of popped popcorn. What is an example of carbohydrate counting? To calculate the grams of carbohydrates in this sample meal, follow the steps below. Sample meal 3 oz (85 g) chicken breast. ? cup or 4 oz (106 g) of brown rice.  cup or 3 oz (85 g) of corn. 1 cup or 8 fl oz (237 mL) of milk. 1 cup or 5 oz (150 g) of strawberries with sugar-free whipped topping. Carbohydrate calculation Identify the foods that have carbohydrates: Rice. Corn. Milk. Strawberries. Calculate how many servings you have of each food: 2 servings of rice. 1 serving of corn. 1 serving of milk. 1 serving of strawberries. Multiply each number of servings by 15 g: 2 servings of rice x 15 g = 30 g. 1 serving of corn x 15 g = 15 g. 1 serving of milk x 15 g = 15 g. 1 serving of strawberries x 15 g = 15 g. Add together all of the amounts to find the total grams of carbohydrates eaten: 30 g + 15 g + 15 g + 15 g = 75 g of carbohydrates total. Where to find more  information To learn more, go to: American Diabetes Association at diabetes.org. Click Search and type carb counting. Find the link you need. Centers for Disease Control and Prevention at TonerPromos.no. Click Search and type diabetes. Find the link you need. Academy of Nutrition and Dietetics: eatright.org This information is not intended to replace advice given to you by your health care provider. Make sure you discuss any questions you have with your health care provider. Document Revised: 12/09/2022 Document Reviewed: 12/09/2022 Elsevier Patient Education  2025 [employer].

## 2023-11-24 DIAGNOSIS — E669 Obesity, unspecified: Secondary | ICD-10-CM

## 2023-11-24 NOTE — Telephone Encounter (Signed)
 Clinical questions have been answered and PA submitted. PA currently Pending. Please be advised that most companies allow up to 30 days to make a decision. We will advise when a determination has been made, or follow up in 1 week.   Please reach out to our team, Rx Prior Auth Pool, if you haven't heard back in a week.

## 2023-11-24 NOTE — Telephone Encounter (Signed)
 Pharmacy Patient Advocate Encounter  Received notification from [employer] that Prior Authorization for Mounjaro 2.5MG /0.5ML auto-injectors  has been DENIED.  Full denial letter will be uploaded to the media tab. See denial reason below.  **Patient has only tried Ozempic . Insurance requires 2 of the preferred listed.**     PA #/Case ID/Reference #: [id number]

## 2023-11-25 ENCOUNTER — Telehealth: Payer: Self-pay | Admitting: Pharmacist

## 2023-11-25 MED ORDER — OZEMPIC (0.25 OR 0.5 MG/DOSE) 2 MG/3ML ~~LOC~~ SOPN: 0.2500 mg | PEN_INJECTOR | SUBCUTANEOUS | 2 refills | Status: AC

## 2023-11-25 NOTE — Telephone Encounter (Signed)
 Patient notified and verbalized understanding.

## 2023-11-25 NOTE — Telephone Encounter (Signed)
 PA submitted by PharmD

## 2023-11-26 ENCOUNTER — Other Ambulatory Visit (HOSPITAL_COMMUNITY): Payer: Self-pay

## 2023-12-02 ENCOUNTER — Telehealth: Admitting: Physician Assistant

## 2023-12-02 DIAGNOSIS — B9689 Other specified bacterial agents as the cause of diseases classified elsewhere: Secondary | ICD-10-CM | POA: Diagnosis not present

## 2023-12-02 DIAGNOSIS — J019 Acute sinusitis, unspecified: Secondary | ICD-10-CM

## 2023-12-02 MED ORDER — DOXYCYCLINE HYCLATE 100 MG PO TABS: 100.0000 mg | ORAL_TABLET | Freq: Two times a day (BID) | ORAL | 0 refills | Status: AC

## 2023-12-02 NOTE — Progress Notes (Signed)

## 2024-02-22 ENCOUNTER — Ambulatory Visit: Admitting: Nurse Practitioner
